# Patient Record
Sex: Female | Born: 1965 | Race: White | Marital: Single | Smoking: Current every day smoker
Health system: Northeastern US, Academic
[De-identification: ages and names within clinical notes are randomized; demographics above are authoritative.]

## PROBLEM LIST (undated history)

## (undated) DIAGNOSIS — G43909 Migraine, unspecified, not intractable, without status migrainosus: Secondary | ICD-10-CM

## (undated) DIAGNOSIS — M545 Low back pain, unspecified: Secondary | ICD-10-CM

## (undated) DIAGNOSIS — F419 Anxiety disorder, unspecified: Secondary | ICD-10-CM

## (undated) DIAGNOSIS — G4710 Hypersomnia, unspecified: Secondary | ICD-10-CM

## (undated) DIAGNOSIS — F329 Major depressive disorder, single episode, unspecified: Secondary | ICD-10-CM

## (undated) DIAGNOSIS — Z9989 Dependence on other enabling machines and devices: Secondary | ICD-10-CM

## (undated) DIAGNOSIS — Q796 Ehlers-Danlos syndrome, unspecified: Secondary | ICD-10-CM

## (undated) DIAGNOSIS — G4733 Obstructive sleep apnea (adult) (pediatric): Secondary | ICD-10-CM

## (undated) DIAGNOSIS — F32A Depression, unspecified: Secondary | ICD-10-CM

## (undated) HISTORY — DX: Anxiety disorder, unspecified: F41.9

## (undated) HISTORY — DX: Ehlers-Danlos syndrome, unspecified: Q79.60

## (undated) HISTORY — DX: Major depressive disorder, single episode, unspecified: F32.9

## (undated) HISTORY — DX: Dependence on other enabling machines and devices: Z99.89

## (undated) HISTORY — DX: Low back pain: M54.5

## (undated) HISTORY — DX: Depression, unspecified: F32.A

## (undated) HISTORY — DX: Hypersomnia, unspecified: G47.10

## (undated) HISTORY — DX: Obstructive sleep apnea (adult) (pediatric): G47.33

## (undated) HISTORY — DX: Low back pain, unspecified: M54.50

## (undated) HISTORY — DX: Migraine, unspecified, not intractable, without status migrainosus: G43.909

## (undated) NOTE — Progress Notes (Signed)
Formatting of this note is different from the original.  Annual Gyn Exam  Bonnie Faulkner is a 78 y.o. postmenopausal female who presents for an annual exam.  The patient has no complaints today.  Notes that she stopped her patches and didn't have any hot flashes or night sweats.      Mammogram: normal--routine follow-up in 12 months  Colonoscopy: doing yearly stool samples  The patient was taught and performs regular BSE.    The patient does wear seatbelts.  The patient does not participate in regular exercise.  The patient does consume fruit and fiber in her diet.    Sexual dysfunction:no not active    The patient reports that there is No domestic violence in her life.      Personal health questionnaire reviewed.    Problem List:  Patient Active Problem List   Diagnosis   ? Vulvar intraepithelial neoplasia III (VIN III)   ? Moderate obstructive sleep apnea     Patient History*    Past Medical History:   Diagnosis Date   ? Breast disorder    ? 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 Mental disorder    ? 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  Stable at last recorded visit in [place] in 2013.     Past Surgical History:   Procedure Laterality Date   ? BACK SURGERY     ? HYSTERECTOMY      partial   ? KNEE SURGERY     ? LAMINECTOMY     ? THUMB FUSION     ? TOE SURGERY       Social History     Tobacco Use   ? Smoking status: Every Day     Packs/day: 0.50     Types: Cigarettes   ? Smokeless tobacco: Never   Substance Use Topics   ? Alcohol use: Yes     Alcohol/week: 1.0 standard drink     Types: 1 Shots of liquor per week     Comment: socially daily     Family History   Problem Relation Age of Onset   ? Liver cancer Maternal Grandfather    ? Ovarian cancer Mother    ? Melanoma Brother    ? Stroke Maternal Grandmother    ? Heart disease Father        Menstrual History:  OB History     Gravida   0    Para   0    Term   0    Preterm   0     AB   0    Living   0      SAB   0    IAB   0    Ectopic   0    Multiple   0    Live Births   0           Menarche age:   No LMP recorded. Patient has had a hysterectomy.      No LMP recorded. Patient has had a hysterectomy.    Review of Systems:  Pertinent items are noted in HPI. Annual update form reviewed in detail with the patient.    Vitals:    09/22/21 1358   BP: (!) 142/70   Pulse:    Temp:     Body mass index is 38.27 kg/m.  Visit Vitals  BP (!) 142/70 (BP Location: Left arm, Patient Position: Sitting)   Pulse 88   Temp 36.7 C (98 F)   Ht 5\' 5"  (1.651 m)   Wt 230 lb (104.3 kg)   BMI 38.27 kg/m   OB Status Hysterectomy   Smoking Status Every Day   BSA 2.19 m     General: Well developed, well nourished female in NAD.  Psych:   Alert, oriented to time, place and person. Normal mood and affect.  Neuro:   Grossly intact.  Skin:      Warm, normal turgor, no rashes, no suspicious lesions or ulcers.  HEENT:  NCAT, Normal conjunctiva, Pupils equal and round, Hearing WNL, Lips without leslons.  Neck/Thyroid: Non tender. No thyromegaly noted.  Lungs: CTA bilaterally, normal respiratory effort.  Cardiovascular: RRR, no murmurs.  Back: No CVAT.  Abdomen: Soft, NT non distended, no masses, hernia or hepato/splenomegaly  Breasts: No suspicious masses, no nipple discharge, no dimpling  Lymphatic: No cervical, supraclaviculor, axillary or groin adenopathy  Extremities: Non tender, no edema, Free range of motion  Pelvic Exam:  External Genitalia: Normal structures. No suspicious lesions.  Bladder: Normal. No masses, non tender, no significant prolapse.  BUS: Normal Bartholin's, urethral and Skene's glands. No masses, non tender.  Vagina: No vaginal discharge, lesions or bleeding.  Cervix:  Surgically absent.  Uterus: Surgically absent.  Adnexae:  Right No masses, No tenderness.  Left:  No masses, No tenderness.  Perineum: Normal. No suspicious lesions.  Rectal: No hemorrhoids    Depression Screening:  Little interest or  pleasure in doing things: no   Feeling down depressed or hopeless: no     Assessment:     Healthy female exam.     Plan:    All questions answered.  Discussed healthy lifestyle modifications. Printed information given.       Diagnosis Plan   1. Encounter for annual routine gynecological examination       2. Vulvar intraepithelial neoplasia III (VIN III)       No new lesions on the vulva today.  Follow up one year for reexamination.     well woman    breast self exam  focused on the need for regular aerobic exercise    Return in about 1 year (around 09/22/2022) for follow up VIN I/AE.    Jeraldine Loots, MD  09/22/2021  2:19 PM  Electronically signed by Jeraldine Loots, MD at 09/22/2021  2:20 PM EST

## (undated) NOTE — Progress Notes (Signed)
Formatting of this note might be different from the original.  Pt presents today for an annual exam.  Chaperone desired: no      Pt has no complaints    Discussion with patient Jeraldine Loots, MD  will review your present concerns to gather more information.  Your visit today is scheduled for an annual examination which is a routine screening and maintenance visit.   You may be asked to return to discuss your concerns further in depth.  If Jeraldine Loots, MD is able to address concerns today it may result in an additional charge/and or copay.     No LMP recorded. Patient has had a hysterectomy.  Last pap: Date: unsure of most recent.    Offered HIV screening and pt Accepted/Declined: declined    Aug 17, 1965  Hep C screening:Pt born between 20-1965 Yes declined    15-25  STD screening offered and Accepted/Declined: declined    40+  Last Mammo: Date: patient states she had one 04/2021    50+  Last colonoscopy: Date: patient states she had never had one    Depression score of 0     Patients blood pressure was elevated at 153/78. Manual recheck done. New reading of 142/70  Electronically signed by Gracy Bruins, LPN at 16/05/9603  1:58 PM EST

---
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
## 1987-08-10 HISTORY — PX: KNEE SURGERY: SHX244

## 2003-08-10 HISTORY — PX: VAGINAL HYSTERECTOMY: SUR661

## 2005-12-09 ENCOUNTER — Ambulatory Visit (HOSPITAL_COMMUNITY): Admission: AD | Admit: 2005-12-09 | Discharge: 2005-12-09 | Payer: Self-pay | Admitting: Family Medicine

## 2008-04-26 ENCOUNTER — Other Ambulatory Visit: Admission: RE | Admit: 2008-04-26 | Discharge: 2008-04-26 | Payer: Self-pay | Admitting: Family Medicine

## 2008-06-03 ENCOUNTER — Other Ambulatory Visit: Admission: RE | Admit: 2008-06-03 | Discharge: 2008-06-03 | Payer: Self-pay | Admitting: Obstetrics and Gynecology

## 2008-06-20 ENCOUNTER — Ambulatory Visit (HOSPITAL_COMMUNITY): Admission: RE | Admit: 2008-06-20 | Discharge: 2008-06-20 | Payer: Self-pay | Admitting: Family Medicine

## 2008-10-01 ENCOUNTER — Other Ambulatory Visit: Admission: RE | Admit: 2008-10-01 | Discharge: 2008-10-01 | Payer: Self-pay | Admitting: Obstetrics and Gynecology

## 2009-10-01 ENCOUNTER — Other Ambulatory Visit: Admission: RE | Admit: 2009-10-01 | Discharge: 2009-10-01 | Payer: Self-pay | Admitting: Obstetrics and Gynecology

## 2010-03-31 ENCOUNTER — Other Ambulatory Visit: Admission: RE | Admit: 2010-03-31 | Discharge: 2010-03-31 | Payer: Self-pay | Admitting: Obstetrics and Gynecology

## 2010-05-21 ENCOUNTER — Ambulatory Visit (HOSPITAL_COMMUNITY): Admission: RE | Admit: 2010-05-21 | Discharge: 2010-05-21 | Payer: Self-pay | Admitting: Family Medicine

## 2010-10-15 ENCOUNTER — Other Ambulatory Visit (HOSPITAL_COMMUNITY)
Admission: RE | Admit: 2010-10-15 | Discharge: 2010-10-15 | Disposition: A | Discharge status: Home / Self Care | Payer: Commercial Indemnity | Source: Ambulatory Visit | Attending: Obstetrics and Gynecology | Admitting: Obstetrics and Gynecology

## 2010-10-15 DIAGNOSIS — Z01419 Encounter for gynecological examination (general) (routine) without abnormal findings: Secondary | ICD-10-CM | POA: Insufficient documentation

## 2011-06-04 ENCOUNTER — Other Ambulatory Visit (HOSPITAL_COMMUNITY): Payer: Self-pay | Admitting: Family Medicine

## 2011-06-04 DIAGNOSIS — Z1231 Encounter for screening mammogram for malignant neoplasm of breast: Secondary | ICD-10-CM

## 2011-06-30 ENCOUNTER — Ambulatory Visit (HOSPITAL_COMMUNITY)
Admission: RE | Admit: 2011-06-30 | Discharge: 2011-06-30 | Disposition: A | Discharge status: Home / Self Care | Payer: Commercial Indemnity | Source: Ambulatory Visit | Attending: Family Medicine | Admitting: Family Medicine

## 2011-06-30 DIAGNOSIS — Z1231 Encounter for screening mammogram for malignant neoplasm of breast: Secondary | ICD-10-CM | POA: Insufficient documentation

## 2011-10-19 ENCOUNTER — Other Ambulatory Visit (HOSPITAL_COMMUNITY)
Admission: RE | Admit: 2011-10-19 | Discharge: 2011-10-19 | Disposition: A | Discharge status: Home / Self Care | Payer: Commercial Indemnity | Source: Ambulatory Visit | Attending: Obstetrics and Gynecology | Admitting: Obstetrics and Gynecology

## 2011-10-19 ENCOUNTER — Other Ambulatory Visit: Payer: Self-pay | Admitting: Nurse Practitioner

## 2011-10-19 DIAGNOSIS — Z01419 Encounter for gynecological examination (general) (routine) without abnormal findings: Secondary | ICD-10-CM | POA: Insufficient documentation

## 2012-06-06 ENCOUNTER — Other Ambulatory Visit (HOSPITAL_COMMUNITY): Payer: Self-pay | Admitting: Family Medicine

## 2012-06-06 DIAGNOSIS — Z1231 Encounter for screening mammogram for malignant neoplasm of breast: Secondary | ICD-10-CM

## 2012-06-30 ENCOUNTER — Inpatient Hospital Stay: Admit: 2012-06-30 | Discharge: 2012-06-30 | Disposition: A | Discharge status: Home / Self Care | Payer: Self-pay

## 2012-06-30 ENCOUNTER — Ambulatory Visit (HOSPITAL_COMMUNITY)
Admission: RE | Admit: 2012-06-30 | Discharge: 2012-06-30 | Disposition: A | Discharge status: Home / Self Care | Payer: BC Managed Care – PPO | Source: Ambulatory Visit | Attending: Family Medicine | Admitting: Family Medicine

## 2012-06-30 DIAGNOSIS — Z1231 Encounter for screening mammogram for malignant neoplasm of breast: Secondary | ICD-10-CM

## 2012-11-06 ENCOUNTER — Ambulatory Visit (INDEPENDENT_AMBULATORY_CARE_PROVIDER_SITE_OTHER): Payer: BC Managed Care – PPO | Admitting: Neurology

## 2012-11-06 ENCOUNTER — Other Ambulatory Visit: Payer: Self-pay | Admitting: *Deleted

## 2012-11-06 DIAGNOSIS — G4710 Hypersomnia, unspecified: Secondary | ICD-10-CM

## 2012-11-06 DIAGNOSIS — G4733 Obstructive sleep apnea (adult) (pediatric): Secondary | ICD-10-CM

## 2012-11-07 ENCOUNTER — Encounter: Payer: Self-pay | Admitting: *Deleted

## 2012-11-17 ENCOUNTER — Other Ambulatory Visit: Payer: Self-pay | Admitting: *Deleted

## 2012-11-17 DIAGNOSIS — G4733 Obstructive sleep apnea (adult) (pediatric): Secondary | ICD-10-CM

## 2012-11-17 DIAGNOSIS — G4710 Hypersomnia, unspecified: Secondary | ICD-10-CM

## 2012-11-28 ENCOUNTER — Encounter: Payer: Self-pay | Admitting: Neurology

## 2012-12-04 ENCOUNTER — Telehealth: Payer: Self-pay | Admitting: *Deleted

## 2012-12-04 NOTE — Telephone Encounter (Signed)
LM for patient at home, tried cell but no voicemail option setup.  Asked pt to call me and we could discuss sleep study results.  Also let her know a copy would be mailed to her. -sh

## 2013-01-19 ENCOUNTER — Ambulatory Visit (INDEPENDENT_AMBULATORY_CARE_PROVIDER_SITE_OTHER): Payer: BC Managed Care – PPO | Admitting: Neurology

## 2013-01-19 ENCOUNTER — Encounter: Payer: Self-pay | Admitting: Neurology

## 2013-01-19 VITALS — BP 132/84 | HR 62 | Temp 98.6°F | Ht 66.0 in | Wt 216.0 lb

## 2013-01-19 DIAGNOSIS — G4710 Hypersomnia, unspecified: Secondary | ICD-10-CM | POA: Insufficient documentation

## 2013-01-19 DIAGNOSIS — G4733 Obstructive sleep apnea (adult) (pediatric): Secondary | ICD-10-CM | POA: Insufficient documentation

## 2013-01-19 DIAGNOSIS — F329 Major depressive disorder, single episode, unspecified: Secondary | ICD-10-CM

## 2013-01-19 NOTE — Patient Instructions (Addendum)
CPAP and BIPAP CPAP and BIPAP are methods of helping you breathe. CPAP stands for "continuous positive airway pressure." BIPAP stands for "bi-level positive airway pressure." Both CPAP and BIPAP are provided by a small machine with a flexible plastic tube that attaches to a plastic mask that goes over your nose or mouth. Air is blown into your air passages through your nose or mouth. This helps to keep your airways open and helps to keep you breathing well. The amount of pressure that is used to blow the air into your air passages can be set on the machine. The pressure setting is based on your needs. With CPAP, the amount of pressure stays the same while you breathe in and out. With BIPAP, the amount of pressure changes when you inhale and exhale. Your caregiver will recommend whether CPAP or BIPAP would be more helpful for you.  CPAP and BIPAP can be helpful for both adults and children with:  Sleep apnea.  Chronic Obstructive Pulmonary Disease (COPD), a condition like emphysema.  Diseases which weaken the muscles of the chest such as muscular dystrophy or neurological diseases.  Other problems that cause breathing to be weak or difficult. USE OF CPAP OR BIPAP The respiratory therapist or technician will help you get used to wearing the mask. Some people feel claustrophobic (a trapped or closed in feeling) at first, because the mask needs to be fairly snug on your face.   It may help you to get used to the mask gradually, by first holding the mask loosely over your nose or mouth using a low pressure setting on the machine. Gradually the mask can be applied more snugly with increased pressure. You can also gradually increase the amount of time the mask is used.  People with sleep apnea will use the mask and machine at night when they are sleeping. Others, like those with ALS or other breathing difficulties, may need the CPAP or BIPAP all the time.  If the first mask you try does not fit well, or  is uncomfortable, there are other types and sizes that can be tried.  If you tend to breathe through your mouth, a chin strap may be applied to help keep your mouth closed (if you are using a nasal mask).  The CPAP and BIPAP machines have alarms that may sound if the mask comes off or develops a leak.  You should not eat or drink while the CPAP or BIPAP is on. Food or fluids could get pushed into your lungs by the pressure of the CPAP or BIPAP. Sometimes CPAP or BIPAP machines are ordered for home use. If you are going to use the CPAP or BIPAP machine at home, follow these instructions  CPAP or BIPAP machines can be rented or purchased through home health care companies. There are many different brands of machines available. If you rent a machine before purchasing you may find which particular machine works well for you.  Ask questions if there is something you do not understand when picking out your machine.  Place your CPAP or BIPAP machine on a secure table or stand near an electrical outlet.  Know where the On/Off switch is.  Follow your doctor's instructions for how to set the pressure on your machine and when you should use it.  Do not smoke! Tobacco smoke residue can damage the machine. SEEK IMMEDIATE MEDICAL CARE IF:   You have redness or open areas around your nose or mouth.  You have trouble operating  the CPAP or BIPAP machine.  You cannot tolerate wearing the CPAP or BIPAP mask.  You have any questions or concerns. Document Released: 04/23/2004 Document Revised: 10/18/2011 Document Reviewed: 07/23/2008 [facility] Patient Information 2014 [place], [place]. Exercise to Lose Weight Exercise and a healthy diet may help you lose weight. Your doctor may suggest specific exercises. EXERCISE IDEAS AND TIPS  Choose low-cost things you enjoy doing, such as walking, bicycling, or exercising to workout videos.  Take stairs instead of the elevator.  Walk during your lunch  break.  Park your car further away from work or school.  Go to a gym or an exercise class.  Start with 5 to 10 minutes of exercise each day. Build up to 30 minutes of exercise 4 to 6 days a week.  Wear shoes with good support and comfortable clothes.  Stretch before and after working out.  Work out until you breathe harder and your heart beats faster.  Drink extra water when you exercise.  Do not do so much that you hurt yourself, feel dizzy, or get very short of breath. Exercises that burn about 150 calories:  Running 1  miles in 15 minutes.  Playing volleyball for 45 to 60 minutes.  Washing and waxing a car for 45 to 60 minutes.  Playing touch football for 45 minutes.  Walking 1  miles in 35 minutes.  Pushing a stroller 1  miles in 30 minutes.  Playing basketball for 30 minutes.  Raking leaves for 30 minutes.  Bicycling 5 miles in 30 minutes.  Walking 2 miles in 30 minutes.  Dancing for 30 minutes.  Shoveling snow for 15 minutes.  Swimming laps for 20 minutes.  Walking up stairs for 15 minutes.  Bicycling 4 miles in 15 minutes.  Gardening for 30 to 45 minutes.  Jumping rope for 15 minutes.  Washing windows or floors for 45 to 60 minutes. Document Released: 08/28/2010 Document Revised: 10/18/2011 Document Reviewed: 08/28/2010 [facility] Patient Information 2014 [place], [place]. Depression, Adult Depression refers to feeling sad, low, down in the dumps, blue, gloomy, or empty. In general, there are two kinds of depression: 1. Depression that we all experience from time to time because of upsetting life experiences, including the loss of a job or the ending of a relationship (normal sadness or normal grief). This kind of depression is considered normal, is short lived, and resolves within a few days to 2 weeks. (Depression experienced after the loss of a loved one is called bereavement. Bereavement often lasts longer than 2 weeks but normally gets better with  time.) 2. Clinical depression, which lasts longer than normal sadness or normal grief or interferes with your ability to function at home, at work, and in school. It also interferes with your personal relationships. It affects almost every aspect of your life. Clinical depression is an illness. Symptoms of depression also can be caused by conditions other than normal sadness and grief or clinical depression. Examples of these conditions are listed as follows:  Physical illness Some physical illnesses, including underactive thyroid gland (hypothyroidism), severe anemia, specific types of cancer, diabetes, uncontrolled seizures, heart and lung problems, strokes, and chronic pain are commonly associated with symptoms of depression.  Side effects of some prescription medicine In some people, certain types of prescription medicine can cause symptoms of depression.  Substance abuse Abuse of alcohol and illicit drugs can cause symptoms of depression. SYMPTOMS Symptoms of normal sadness and normal grief include the following:  Feeling sad or crying for short  periods of time.  Not caring about anything (apathy).  Difficulty sleeping or sleeping too much.  No longer able to enjoy the things you used to enjoy.  Desire to be by oneself all the time (social isolation).  Lack of energy or motivation.  Difficulty concentrating or remembering.  Change in appetite or weight.  Restlessness or agitation. Symptoms of clinical depression include the same symptoms of normal sadness or normal grief and also the following symptoms:  Feeling sad or crying all the time.  Feelings of guilt or worthlessness.  Feelings of hopelessness or helplessness.  Thoughts of suicide or the desire to harm yourself (suicidal ideation).  Loss of touch with reality (psychotic symptoms). Seeing or hearing things that are not real (hallucinations) or having false beliefs about your life or the people around you (delusions  and paranoia). DIAGNOSIS  The diagnosis of clinical depression usually is based on the severity and duration of the symptoms. Your caregiver also will ask you questions about your medical history and substance use to find out if physical illness, use of prescription medicine, or substance abuse is causing your depression. Your caregiver also may order blood tests. TREATMENT  Typically, normal sadness and normal grief do not require treatment. However, sometimes antidepressant medicine is prescribed for bereavement to ease the depressive symptoms until they resolve. The treatment for clinical depression depends on the severity of your symptoms but typically includes antidepressant medicine, counseling with a mental health professional, or a combination of both. Your caregiver will help to determine what treatment is best for you. Depression caused by physical illness usually goes away with appropriate medical treatment of the illness. If prescription medicine is causing depression, talk with your caregiver about stopping the medicine, decreasing the dose, or substituting another medicine. Depression caused by abuse of alcohol or illicit drugs abuse goes away with abstinence from these substances. Some adults need professional help in order to stop drinking or using drugs. SEEK IMMEDIATE CARE IF:  You have thoughts about hurting yourself or others.  You lose touch with reality (have psychotic symptoms).  You are taking medicine for depression and have a serious side effect. FOR MORE INFORMATION National [employer] on Mental Illness: www.nami.[employer]: http://www.maynard.net/ Document Released: 07/23/2000 Document Revised: 01/25/2012 Document Reviewed: 10/25/2011 [facility] Patient Information 2014 [place], [place]. Hypersomnia Hypersomnia usually brings recurrent episodes of excessive daytime sleepiness or prolonged nighttime sleep. It is different than feeling tired due to  lack of or interrupted sleep at night. People with hypersomnia are compelled to nap repeatedly during the day. This is often at inappropriate times such as:  At work.  During a meal.  In conversation. These daytime naps usually provide no relief. This disorder typically affects adolescents and young adults. CAUSES  This condition may be caused by:  Another sleep disorder (such as narcolepsy or sleep apnea).  Dysfunction of the autonomic nervous system.  Drug or alcohol abuse.  A physical problem, such as:  A tumor.  Head trauma. This is damage caused by an accident.  Injury to the central nervous system.  Certain medications, or medicine withdrawal.  Medical conditions may contribute to the disorder, including:  Multiple sclerosis.  Depression.  Encephalitis.  Epilepsy.  Obesity.  Some people appear to have a genetic predisposition to this disorder. In others, there is no known cause. SYMPTOMS   Patients often have difficulty waking from a long sleep. They may feel dazed or confused.  Other symptoms may include:  Anxiety.  Increased irritation (inflammation).  Decreased energy.  Restlessness.  Slow thinking.  Slow speech.  Loss of appetite.  Hallucinations.  Memory difficulty.  Tremors, Tics.  Some patients lose the ability to function in family, social, occupational, or other settings. TREATMENT  Treatment is symptomatic in nature. Stimulants and other drugs may be used to treat this disorder. Changes in behavior may help. For example, avoid night work and social activities that delay bed time. Changes in diet may offer some relief. Patients should avoid alcohol and caffeine. PROGNOSIS  The likely outcome (prognosis) for persons with hypersomnia depends on the cause of the disorder. The disorder itself is not life threatening. But it can have serious consequences. For example, automobile accidents can be caused by falling asleep while driving.  The attacks usually continue indefinitely. Document Released: 07/16/2002 Document Revised: 10/18/2011 Document Reviewed: 06/19/2008 [facility] Patient Information 2014 [place], [place].

## 2013-01-19 NOTE — Progress Notes (Addendum)
[facility]  Provider:  Dr Tanya Stanley Referring Provider: No ref. provider found Primary Care Physician:  No primary provider on file.  Chief Complaint  Patient presents with  . Follow-up    excessive sleepiness    HPI:  Tanya Stanley is a 47 y.o. female here as a referral from Dr. Carolin Stanley. [name] was referred for evaluation of possible narcolepsy and seen on 10-20-12. At that time she had a history of excessive daytime sleepiness for many years but she also had an acute depression. She stated that she awoke barely refreshed in the morning even after getting over on estimates 7-8 hours of sleep. She naps whenever she could.  She has  no shift work history and she had begun to schedule herlive around  Nap times. Epworth was initially 15 times in the night of the sleep study , her Epworth was 20 points.  Was  on multiple antidepressants at the time and it was deemed not possible to have a valid daytime sleepiness test such as MSLT.( She was on REM sleep suppressant medications)  We  discussed the weaning schedule in March to prepare her for possible test.  Nuvigil l was prescribed,  but her new insurance refused to pay for it "without a sleep diagnosis ". The patient underwent 1 polysomnography in 2012, which failed to detect any apnea at the time. In the interval she gained weight, about 40 pounds. In suffer from major depression which was treated this medication that also led to weight gain.  On 11/06/2012 the patient underwent a split night polysomnography. Her BMI was 37 at the time Epworth score 20 points , Beck's  Score 13 points. Her  neck circumference 16 inches.   AHI was 27.9 RDI 36.9 the patient had a significant contribution to her poor sleep quality from respiratory events that were deemed to be related to upper airway syndrome. The arousal index was very high at 31.5 the patient was titrated first of CPAP which she could not tolerate and dental BiPAP over 10  cm of a 6 cm water which was effective. The patient did not have to stay r the next day after the identified an organic sleep disorder as a possible cause of her hypersomnia.    In May she finally received the BiPAP machine and has now used it for 30 days. Reports that many nights she pulled the mask off she does not like to use the BiPAP and she has not felt that it made a difference in her daytime sleepiness at all. Patient is still continued to take naps.   Her download shows an  93 % compliance ,  AHi is 1.8. 01-19-13 .  The patient is now working from 5:30 AM today to 2 PM. Her sleep time is irregular, sometimes at 6 PM sometimes 11 PM. She falls asleep within 15-20 minutes. He has no nocturia. She has no history of restless legs , was never told that she snores or has any apneas. The persistent hypersomnia with an Epworth score of 13 pints today is slightly improved in comparison to the pre-study level. Beck's 13 points, FSS is 63.  The patient reports that she became very tearful after weaning off  Prozac, resumed now  Lexapro  and Wellbutrin. She remains tearful and depressed.    [place] is a patient that we should work on a regular sleep schedule. She has not resumed Wellbutrin but Lexapro -we may still want to do the MSL T now based  on her leg of response to the BiPAP titration. I will schedule her for an MSL T. as well as possible, ask her to just stop the Lexapro for the next couple of days until hopefully the test can be done. She asked  not be scheduled with a female.         Review of Systems: Out of a complete 14 system review, the patient complains of only the following symptoms, and all other reviewed systems are negative. Epworth 13 ,  Depression, frustration, impulsive, anti social.  History   Social History  . Marital Status: Single    Spouse Name: N/A    Number of Children: 0  . Years of Education: BA   Occupational History  . Not on file.   Social History  Main Topics  . Smoking status: Current Every Day Smoker -- 0.75 packs/day for 20 years  . Smokeless tobacco: Never Used  . Alcohol Use: Yes     Comment: 5 mixed drinks weekly  . Drug Use: No  . Sexually Active: Not on file   Other Topics Concern  . Not on file   Social History Narrative   Pt lives at home alone.   Caffeine Use: 1-2 cups daily    Family History  Problem Relation Age of Onset  . Ovarian cancer Mother     Past Medical History  Diagnosis Date  . EDS (Ehlers-Danlos syndrome)   . Migraine   . Anxiety and depression   . LBP (low back pain)   . Obstructive apnea     PSG 11-06-12, AHI 27.9 , RDI 36.9 , bipap  10/6 cm   . Hypersomnia, persistent     continued Epworth 20 and higher on BIPAP>     Past Surgical History  Procedure Laterality Date  . Toe surgery  1985    ingrown toenail  . Knee surgery  1989  . Vaginal hysterectomy  2005    Current Outpatient Prescriptions  Medication Sig Dispense Refill  . calcium-vitamin D (OSCAL) 250-125 MG-UNIT per tablet Take 2 tablets by mouth daily.      Marland Kitchen co-enzyme Q-10 50 MG capsule Take 50 mg by mouth daily.      Marland Kitchen escitalopram (LEXAPRO) 10 MG tablet Take 10 mg by mouth every other day.      . ferrous sulfate 325 (65 FE) MG tablet Take 325 mg by mouth daily with breakfast.      . Multiple Vitamins-Minerals (MULTIVITAMIN PO) Take 1 tablet by mouth daily.      . Omega-3 Fatty Acids (FISH OIL) 1000 MG CAPS Take by mouth.      . valACYclovir (VALTREX) 1000 MG tablet Take 1,000 mg by mouth as needed.      . Vitamin B Complex-C CAPS Take 1 capsule by mouth daily.      Marland Kitchen ALPRAZolam (XANAX) 0.5 MG tablet Take 1 tablet by mouth as needed.      . fluticasone (FLONASE) 50 MCG/ACT nasal spray Place 2 sprays into the nose daily.       No current facility-administered medications for this visit.    Allergies as of 01/19/2013  . (No Known Allergies)    Vitals: BP 132/84  Pulse 62  Temp(Src) 98.6 F (37 C) (Oral)  Ht 5\' 6"   (1.676 m)  Wt 216 lb (97.977 kg)  BMI 34.88 kg/m2 Last Weight:  Wt Readings from Last 1 Encounters:  01/19/13 216 lb (97.977 kg)   Last Height:   Ht Readings  from Last 1 Encounters:  01/19/13 5\' 6"  (1.676 m)     Physical exam:  General: The patient is awake, alert and appears not in acute distress. The patient is well groomed. Head: Normocephalic, atraumatic. Neck is supple. Mallampati3, neck circumference: 16.5 inches  Cardiovascular:  Regular rate and rhythm, without  murmurs or carotid bruit, and without distended neck veins. Respiratory: Lungs are clear to auscultation. Skin:  Without evidence of edema, or rash Trunk: BMI is elevated .  Neurologic exam : The patient is awake and alert, oriented to place and time.  Memory subjective  described as intact. There is a limitedl attention span & concentration ability. Speech is fluent without  dysarthria, dysphonia or aphasia. Mood and affect are appropriate.  Cranial nerves: Pupils are equal and briskly reactive to light. Funduscopic exam without  evidence of pallor or edema. Extraocular movements  in vertical and horizontal planes intact and without nystagmus. Visual fields by finger perimetry are intact. Hearing to finger rub intact.  Facial sensation intact to fine touch. Facial motor strength is symmetric and tongue and uvula move midline.  Motor exam: Normal tone and normal muscle bulk and symmetric normal strength in all extremities.     Assessment:  After physical and neurologic examination, review of laboratory studies, imaging, neurophysiology testing and pre-existing records, assessment will be reviewed on the problem list.  Depression Hypersomia Obesity Apnea on BiPAP   Plan:  Treatment plan and additional workup will be reviewed under Problem List.

## 2013-01-29 ENCOUNTER — Encounter: Payer: Self-pay | Admitting: Neurology

## 2013-01-29 ENCOUNTER — Ambulatory Visit (INDEPENDENT_AMBULATORY_CARE_PROVIDER_SITE_OTHER): Payer: BC Managed Care – PPO | Admitting: Neurology

## 2013-01-29 DIAGNOSIS — G4710 Hypersomnia, unspecified: Secondary | ICD-10-CM

## 2013-01-29 DIAGNOSIS — G4713 Recurrent hypersomnia: Secondary | ICD-10-CM

## 2013-01-29 DIAGNOSIS — G4733 Obstructive sleep apnea (adult) (pediatric): Secondary | ICD-10-CM

## 2013-01-30 DIAGNOSIS — G4733 Obstructive sleep apnea (adult) (pediatric): Secondary | ICD-10-CM

## 2013-01-30 DIAGNOSIS — G4713 Recurrent hypersomnia: Secondary | ICD-10-CM

## 2013-02-07 ENCOUNTER — Other Ambulatory Visit: Payer: Self-pay | Admitting: Neurology

## 2013-02-07 DIAGNOSIS — G4733 Obstructive sleep apnea (adult) (pediatric): Secondary | ICD-10-CM

## 2013-02-07 DIAGNOSIS — G4710 Hypersomnia, unspecified: Secondary | ICD-10-CM

## 2013-02-12 ENCOUNTER — Telehealth: Payer: Self-pay | Admitting: *Deleted

## 2013-02-12 ENCOUNTER — Encounter: Payer: Self-pay | Admitting: *Deleted

## 2013-02-12 NOTE — Telephone Encounter (Signed)
Left message for patient regarding sleep study results, asked patient to call me back to discuss results and have questions answered.  Explained that a copy of the sleep study was sent to referring physician and copy of study is coming to them in the mail.  Pt made aware that current BiPAP settings are working very well for her but that she did fall asleep fairly quickly during her MSLT and a follow up appt with Dr. Vickey Huger to discuss those results is needed.

## 2013-02-12 NOTE — Telephone Encounter (Signed)
Spk to [facility] regarding test results and follow up appt with Dr. Vickey Stanley.  Pt states she has used Provigil which Dr. Gerri Stanley prescribed and it was very helpful to her, it was not covered by her insurance company however without the MSLT.  Patient feels a little urgent in trying to get treatment for her EDS because she is going out of town soon and has to drive 11 hours to [place] - Dr. Clyde Stanley advised her not to drive without treatment.  We rescheduled patient's follow up appt with Dr. Vickey Stanley from the 17th to Thursday the 10th in order to get treatment covered sooner.  Pt states if there is anything we can do prior to this appt to get this medication authorized, she would be very appreciative because she is also concerned about driving but has to drive.  We discussed Dr. Oliva Stanley impressions that untreated Depression may be a very likely contributor to Tanya Stanley's EDS and her non-restorative sleep, she agrees.  She tried to get appt with Tanya Stanley however her first available appt isn't until August, however she was able to schedule with Tanya Stanley for a week from this Thursday in order to get some help.

## 2013-02-13 ENCOUNTER — Telehealth: Payer: Self-pay | Admitting: Neurology

## 2013-02-13 NOTE — Telephone Encounter (Signed)
Hello Dr. Vickey Huger, I moved this patient's appt up to this Thursday at 11:30 (right before lunch and at the end of your read time). See phone note for more info. Pt is leaving town and has a long drive, she is very concerned about her sleepiness and would like to discuss treatment for EDS sooner if possible. If there is a problem with this appt, please let me know and I can try and reschedule, this was the only place I could see putting her this week, there was nothing else available. Thank you!  SH  02-13-13

## 2013-02-15 ENCOUNTER — Encounter: Payer: Self-pay | Admitting: Neurology

## 2013-02-15 ENCOUNTER — Ambulatory Visit (INDEPENDENT_AMBULATORY_CARE_PROVIDER_SITE_OTHER): Payer: BC Managed Care – PPO | Admitting: Neurology

## 2013-02-15 VITALS — BP 150/93 | HR 76 | Resp 14 | Ht 66.0 in | Wt 222.0 lb

## 2013-02-15 DIAGNOSIS — G4730 Sleep apnea, unspecified: Secondary | ICD-10-CM

## 2013-02-15 DIAGNOSIS — E669 Obesity, unspecified: Secondary | ICD-10-CM | POA: Insufficient documentation

## 2013-02-15 DIAGNOSIS — G4710 Hypersomnia, unspecified: Secondary | ICD-10-CM

## 2013-02-15 DIAGNOSIS — G4733 Obstructive sleep apnea (adult) (pediatric): Secondary | ICD-10-CM

## 2013-02-15 MED ORDER — ARMODAFINIL 250 MG PO TABS: 250.0000 mg | ORAL_TABLET | Freq: Every day | ORAL | Status: DC

## 2013-02-15 NOTE — Patient Instructions (Signed)
Hypersomnia Hypersomnia usually brings recurrent episodes of excessive daytime sleepiness or prolonged nighttime sleep. It is different than feeling tired due to lack of or interrupted sleep at night. People with hypersomnia are compelled to nap repeatedly during the day. This is often at inappropriate times such as:  At work.  During a meal.  In conversation. These daytime naps usually provide no relief. This disorder typically affects adolescents and young adults. CAUSES  This condition may be caused by:  Another sleep disorder (such as narcolepsy or sleep apnea).  Dysfunction of the autonomic nervous system.  Drug or alcohol abuse.  A physical problem, such as:  A tumor.  Head trauma. This is damage caused by an accident.  Injury to the central nervous system.  Certain medications, or medicine withdrawal.  Medical conditions may contribute to the disorder, including:  Multiple sclerosis.  Depression.  Encephalitis.  Epilepsy.  Obesity.  Some people appear to have a genetic predisposition to this disorder. In others, there is no known cause. SYMPTOMS   Patients often have difficulty waking from a long sleep. They may feel dazed or confused.  Other symptoms may include:  Anxiety.  Increased irritation (inflammation).  Decreased energy.  Restlessness.  Slow thinking.  Slow speech.  Loss of appetite.  Hallucinations.  Memory difficulty.  Tremors, Tics.  Some patients lose the ability to function in family, social, occupational, or other settings. TREATMENT  Treatment is symptomatic in nature. Stimulants and other drugs may be used to treat this disorder. Changes in behavior may help. For example, avoid night work and social activities that delay bed time. Changes in diet may offer some relief. Patients should avoid alcohol and caffeine. PROGNOSIS  The likely outcome (prognosis) for persons with hypersomnia depends on the cause of the disorder.  The disorder itself is not life threatening. But it can have serious consequences. For example, automobile accidents can be caused by falling asleep while driving. The attacks usually continue indefinitely. Document Released: 07/16/2002 Document Revised: 10/18/2011 Document Reviewed: 06/19/2008 Exit[employer] Patient Information 2014 [employer].  

## 2013-02-15 NOTE — Assessment & Plan Note (Signed)
Patient remains hypersomnic on BiPAP . Epworth 11 on modafinil.

## 2013-02-15 NOTE — Progress Notes (Signed)
[facility]  Provider:  Dr Vickey Huger Referring Provider: No ref. provider found Primary Care Physician:  Elie Confer, MD  No chief complaint on file.   HPI:  Tanya Stanley is a 47 y.o. female here as a referral from Dr. Gerri Spore.   BiPAP follow up and MSLT review.   2 we'll try an apnea index to proceed with Lamisil tea in her initial sleep study. The patient was weaned off antidepressant medications in preparation for a valid MS LT for followup. She returned for a PSG on positive airway pressure which revealed nor significant residual apnea her download confirms an AHI of 1.8 she was 100% CMS compliance of 30 days or at worse today it is 11 point is lower than it or past meetings her fatigue severity score remains at 63. Her positive airway pressure test night was developed as time by an MS LT the patient fell asleep with an 4.8 minutes on average in her 5 naps, and no SREM onsets.   I suspect that depression plays a role as well as idiopathic hypersomnia, but clinically she displayed symptoms of narcolepsy.   The patient download was reviewed in her presents it is dated 6-7 2014 the patient used an expiratory pressure of 6 cm water inspiration pressure of 10 cm water she has used the machine 30 over 30 days average daily usage 7 hours 6 minutes very little air leak and an AHI of 1.8. All this was confirmed in  the second sleep study that proceeded the  MS LT - Result was abnormal  ( MSL 4.8 minutes) and the report has been scanned into Epic.      Review of Systems: Out of a complete 14 system review, the patient complains of only the following symptoms, and all other reviewed systems are negative. Hypersomnia and severe depression.   The patient is awake and alert, oriented to place and time.  Memory subjective  described as intact. There is a normal attention span & concentration ability.  Speech is fluent without dysarthria, dysphonia or aphasia. Mood and  affect are appropriate.    Cranial nerves: Pupils are equal and briskly reactive to light. Funduscopic exam without  evidence of pallor or edema. Extraocular movements  in vertical and horizontal planes intact and without nystagmus. Visual fields by finger perimetry are intact. Hearing to finger rub intact.  Facial sensation intact to fine touch. Facial motor strength is symmetric and tongue and uvula move midline.  Motor exam: Normal tone and normal muscle bulk and symmetric normal strength in all extremities.  Sensory:  Fine touch, pinprick and vibration were tested in all extremities. Proprioception  normal.  Coordination: Rapid alternating movements in the fingers/hands is tested and normal. Finger-to-nose maneuver tested and normal without evidence of ataxia, dysmetria or tremor.  Gait and station: Patient walks without assistive device and is able and assisted stool climb up to the exam table. Strength within normal limits. Stance is stable and normal. Tandem gait is  unfragmented. Romberg normal.  Deep tendon reflexes: in the  upper and lower extremities are symmetric and intact. Babinski maneuverdowngoing.   Assessment:  After physical and neurologic examination, review of laboratory studies, imaging, neurophysiology testing and pre-existing records, assessment will be reviewed on the problem list.   PSG, PAP titration, downloads and MSLT reviewed and discussed.  Patient is compliant , 30 days download on BPAP   Plan:  Treatment plan and additional workup will be reviewed under Problem List. This patient is clearly  hypersomnia with a mean sleep latency of only 4.8 minutes also milligram onset was noted her Epworth sleepiness score of remains elevated after she was titrated it is reduced in comparison to pre-treatment of her apnea but it is not at a normal level. The patient is also been a frustrating work situation and is highly fatigued, this could be attributed to a depression.  Still the patient is treated apnea patient was documented compliance to PEEP and will receive more modafinil samples based on her diagnosis of persistent hypersomnia with treated sleep apnea.

## 2013-02-21 ENCOUNTER — Telehealth: Payer: Self-pay

## 2013-02-21 ENCOUNTER — Encounter: Payer: Self-pay | Admitting: Neurology

## 2013-02-21 NOTE — Telephone Encounter (Signed)
Patient called the sleep lab stating her medication (Nuvigil) is not covered by ins and needs authorization.  We have submitted info to ins for PA, pending their response.  I called the patient back.  She would like to get more samples while we are waiting for ins.  I dispensed Nuvigil 250mg  #7 Lot [id number] Exp 11/2015.  She will be in later this week to pick them up.

## 2013-02-22 ENCOUNTER — Institutional Professional Consult (permissible substitution): Payer: BC Managed Care – PPO | Admitting: Neurology

## 2013-06-18 ENCOUNTER — Other Ambulatory Visit (HOSPITAL_COMMUNITY): Payer: Self-pay | Admitting: Family Medicine

## 2013-06-18 DIAGNOSIS — Z1231 Encounter for screening mammogram for malignant neoplasm of breast: Secondary | ICD-10-CM

## 2013-07-09 ENCOUNTER — Ambulatory Visit (HOSPITAL_COMMUNITY): Payer: BC Managed Care – PPO

## 2013-07-10 ENCOUNTER — Telehealth: Payer: Self-pay | Admitting: Neurology

## 2013-07-11 ENCOUNTER — Other Ambulatory Visit: Payer: Self-pay

## 2013-07-11 DIAGNOSIS — G4710 Hypersomnia, unspecified: Secondary | ICD-10-CM

## 2013-07-11 DIAGNOSIS — G4730 Sleep apnea, unspecified: Secondary | ICD-10-CM

## 2013-07-11 MED ORDER — ARMODAFINIL 250 MG PO TABS: 250.0000 mg | ORAL_TABLET | Freq: Every day | ORAL | Status: DC

## 2013-07-11 NOTE — Telephone Encounter (Signed)
Rx faxed

## 2013-07-11 NOTE — Telephone Encounter (Signed)
Request forwarded to provider for approval since this is a controlled substance.

## 2013-07-18 ENCOUNTER — Ambulatory Visit (HOSPITAL_COMMUNITY): Payer: Commercial Indemnity

## 2013-08-13 ENCOUNTER — Inpatient Hospital Stay: Admit: 2013-08-13 | Discharge: 2013-08-13 | Disposition: A | Discharge status: Home / Self Care | Payer: Self-pay

## 2013-08-13 ENCOUNTER — Ambulatory Visit (HOSPITAL_COMMUNITY)
Admission: RE | Admit: 2013-08-13 | Discharge: 2013-08-13 | Disposition: A | Discharge status: Home / Self Care | Payer: 59 | Source: Ambulatory Visit | Attending: Family Medicine | Admitting: Family Medicine

## 2013-08-13 DIAGNOSIS — Z1231 Encounter for screening mammogram for malignant neoplasm of breast: Secondary | ICD-10-CM | POA: Insufficient documentation

## 2013-09-03 ENCOUNTER — Telehealth: Payer: Self-pay | Admitting: Neurology

## 2013-09-03 NOTE — Telephone Encounter (Signed)
All requested info has been sent to the new ins (card scanned today).  Pending response.  They will notify the patient of outcome.

## 2013-09-03 NOTE — Telephone Encounter (Signed)
Patient needs prior authorization for Nuvigil 250 mg.

## 2013-10-15 ENCOUNTER — Telehealth: Payer: Self-pay | Admitting: Neurology

## 2013-10-15 NOTE — Telephone Encounter (Signed)
Pt called in to check on the request for the prescription for Nuvigil be sent to [facility].  She placed her order on 10-04-13 request# [id number] and they are stating that the status is unknown.  She is almost out of her previous 90 day supply so she has asked if she could get a couple of samples to hold her until [facility] sends the new prescription.  Please contact patient with any additional questions. Thank you.

## 2013-10-15 NOTE — Telephone Encounter (Signed)
I called the patient back, got no answer.  Left message.  

## 2013-10-16 ENCOUNTER — Other Ambulatory Visit: Payer: Self-pay

## 2013-10-16 DIAGNOSIS — G4730 Sleep apnea, unspecified: Secondary | ICD-10-CM

## 2013-10-16 DIAGNOSIS — G4710 Hypersomnia, unspecified: Secondary | ICD-10-CM

## 2013-10-16 MED ORDER — ARMODAFINIL 250 MG PO TABS: 250.0000 mg | ORAL_TABLET | Freq: Every day | ORAL | Status: DC

## 2013-10-17 NOTE — Telephone Encounter (Signed)
Rx has been signed and faxed  

## 2013-11-07 ENCOUNTER — Telehealth: Payer: Self-pay | Admitting: Diagnostic Neuroimaging

## 2013-11-07 NOTE — Telephone Encounter (Signed)
Tanya Stanley from [facility] calling to state that [employer] has lost patient's package containing her Nuvigil medication and she keeps calling them but they can't do anything to help her. Patient is requesting a replacement package to be sent to her but prior authorization is needed again. Reference # is [id number]

## 2013-11-07 NOTE — Telephone Encounter (Signed)
We have previously requested a prior auth.  I called the pharmacy back.  Spoke with Fayrene FearingJames.  He said they do not need a PA, only auth to resend the med.  I gave verbal auth to resend med.  They will process this Rx today.

## 2013-11-08 ENCOUNTER — Telehealth: Payer: Self-pay

## 2013-11-08 NOTE — Telephone Encounter (Signed)
[facility] sent us a letter saying no Prior Berkley Harveyuth is required for Nuvigil, as it is a formulary item that IS covered under the plan.  Ref # [id number]

## 2014-04-03 ENCOUNTER — Other Ambulatory Visit: Payer: Self-pay | Admitting: Physician Assistant

## 2014-04-03 ENCOUNTER — Ambulatory Visit
Admission: RE | Admit: 2014-04-03 | Discharge: 2014-04-03 | Disposition: A | Discharge status: Home / Self Care | Payer: 59 | Source: Ambulatory Visit | Attending: Physician Assistant | Admitting: Physician Assistant

## 2014-04-03 DIAGNOSIS — M5489 Other dorsalgia: Secondary | ICD-10-CM

## 2014-04-03 DIAGNOSIS — M25552 Pain in left hip: Secondary | ICD-10-CM

## 2014-05-20 ENCOUNTER — Telehealth: Payer: Self-pay | Admitting: Neurology

## 2014-05-20 DIAGNOSIS — G4710 Hypersomnia, unspecified: Secondary | ICD-10-CM

## 2014-05-20 DIAGNOSIS — G4730 Sleep apnea, unspecified: Secondary | ICD-10-CM

## 2014-05-20 NOTE — Telephone Encounter (Signed)
The patent has not been seen in over 1 year.  I called back.  Got no answer.  Left message. Asked that she please call us back to schedule appt.

## 2014-05-20 NOTE — Telephone Encounter (Signed)
Patient has appointment scheduled 10/19, questioning if she could samples to last until appointment.  Please call and advise.

## 2014-05-20 NOTE — Telephone Encounter (Signed)
I called back and spoke with the patient.  She would like a one month Rx sent to local [facility] and will ask for a regualr Rx to send to mail order when she is in for OV.  Request sent to provider for review

## 2014-05-20 NOTE — Telephone Encounter (Signed)
Patient requesting Rx refill request for Armodafinil (NUVIGIL) 250 MG tablet.  Patient has only two pills left.  Questioning if she could get samples until Rx is refilled.  Please call and advise.

## 2014-05-21 MED ORDER — ARMODAFINIL 250 MG PO TABS: 250.0000 mg | ORAL_TABLET | Freq: Every day | ORAL | Status: DC

## 2014-05-27 ENCOUNTER — Ambulatory Visit (INDEPENDENT_AMBULATORY_CARE_PROVIDER_SITE_OTHER): Payer: 59 | Admitting: Neurology

## 2014-05-27 ENCOUNTER — Encounter: Payer: Self-pay | Admitting: Neurology

## 2014-05-27 VITALS — BP 138/86 | HR 76 | Temp 98.1°F | Resp 14 | Ht 65.0 in | Wt 245.0 lb

## 2014-05-27 DIAGNOSIS — G4730 Sleep apnea, unspecified: Secondary | ICD-10-CM

## 2014-05-27 DIAGNOSIS — G4733 Obstructive sleep apnea (adult) (pediatric): Secondary | ICD-10-CM

## 2014-05-27 DIAGNOSIS — Z9989 Dependence on other enabling machines and devices: Secondary | ICD-10-CM

## 2014-05-27 DIAGNOSIS — G4710 Hypersomnia, unspecified: Secondary | ICD-10-CM

## 2014-05-27 DIAGNOSIS — R5382 Chronic fatigue, unspecified: Secondary | ICD-10-CM

## 2014-05-27 HISTORY — DX: Dependence on other enabling machines and devices: Z99.89

## 2014-05-27 MED ORDER — ARMODAFINIL 250 MG PO TABS: 250.0000 mg | ORAL_TABLET | Freq: Every day | ORAL | Status: DC

## 2014-05-27 NOTE — Progress Notes (Signed)
[facility]   Provider:  Larey Seat, M D  Referring Provider: Hulen Shouts, * Primary Care Physician:  Cleda Mccreedy  Chief Complaint  Patient presents with  . RV sleep    RM 11 Alone    HPI:  Tanya Stanley is a 48 y.o. female  Is seen here as a referral  from Dr. Jonny Ruiz for sleep apnea and CPAP compliance.   Tanya Stanley is here today for her CPAP compliance visit. The patient had a polysomnogram which revealed  mild apnea. The patient was diagnosed with idiopathic hypersomnia but she clinically had states symptoms of narcolepsy.  She was being weaned of anti-depressants  preparation for her MSLT- the result was abnormal as the sleep latency was only 4.8 minutes, but there were no SOREMs .  She has continued to use CPAP faithfully she actually uses a BiPAP with 10 cm water pressure inspiratory and 6 cm pressure expiratory. The AHI has been 0.8 or less for the last 30 days the patient's compliance is excellent. She is using CPAP on average 6 hours a day within the last 90 days. 80% use over 4 hors nightly .  She has isolated nights with high air leak is but most nights air leakage is mild.  The patient has treated her  residual hypersomnia with modafinil. She's here today for yearly refill.  How likely are you to doze in the following situations: 0 = not likely, 1 = slight chance, 2 = moderate chance, 3 = high chance  Sitting and Reading? Watching Television? Sitting inactive in a public place (theater or meeting)? Lying down in the afternoon when circumstances permit? Sitting and talking to someone? Sitting quietly after lunch without alcohol? In a car, while stopped for a few minutes in traffic? As a passenger in a car for an hour without a break?  Total = 6   Sleep habits: patient goes to bed at 8 PM and rises 4 AM, usually needs an alarm. Unfragmented sleep, no vivid dreams. She is on Lexapro and wellbutrin.      Review of Systems: Out  of a complete 14 system review, the patient complains of only the following symptoms, and all other reviewed systems are negative. Obesity, hypersomnia on Nuvigil and on CPAP /BiPAP.  Epworth score 6 , Fatigue severity score 49 , depression score 3 points.    History   Social History  . Marital Status: Single    Spouse Name: N/A    Number of Children: 0  . Years of Education: BA   Occupational History  . 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 Smoking status: Current Every Day Smoker -- 1.00 packs/day for 20 years  . Smokeless tobacco: Never Used  . Alcohol Use: Yes     Comment: 5 mixed drinks weekly  . Drug Use: No  . Sexual Activity: Not on file   Other Topics Concern  . Not on file   Social History Narrative   Pt lives at home alone.   Caffeine Use: 1-2 cups daily   Right handed.   FT- QC specialist   BS Chemistry    Family History  Problem Relation Age of Onset  . Ovarian cancer Mother     Past Medical History  Diagnosis Date  . EDS (Ehlers-Danlos syndrome)   . Migraine   . Anxiety and depression   . LBP (low back pain)   . Obstructive apnea  PSG 11-06-12, AHI 27.9 , RDI 36.9 , bipap  10/6 cm   . Hypersomnia, persistent     continued Epworth 20 and higher on BIPAP>   . Migraine     Past Surgical History  Procedure Laterality Date  . Toe surgery  1985    ingrown toenail  . Knee surgery  1989  . Vaginal hysterectomy  2005    Current Outpatient Prescriptions  Medication Sig Dispense Refill  . ALPRAZolam (XANAX) 0.5 MG tablet Take 1 tablet by mouth as needed.      . Armodafinil (NUVIGIL) 250 MG tablet Take 1 tablet (250 mg total) by mouth daily.  30 tablet  1  . buPROPion (WELLBUTRIN XL) 300 MG 24 hr tablet Take 300 mg by mouth daily.      . calcium-vitamin D (OSCAL) 250-125 MG-UNIT per tablet Take 2 tablets by mouth daily.      Marland Kitchen co-enzyme Q-10 50 MG capsule Take 50 mg by mouth daily.      Marland Kitchen escitalopram (LEXAPRO) 10  MG tablet Take 10 mg by mouth daily.       . ferrous sulfate 325 (65 FE) MG tablet Take 325 mg by mouth daily with breakfast.      . Multiple Vitamins-Minerals (MULTIVITAMIN PO) Take 1 tablet by mouth daily.      . Omega-3 Fatty Acids (FISH OIL) 1000 MG CAPS Take by mouth.      . valACYclovir (VALTREX) 1000 MG tablet Take 1,000 mg by mouth as needed.      . Vitamin B Complex-C CAPS Take 1 capsule by mouth daily.      . fluticasone (FLONASE) 50 MCG/ACT nasal spray Place 2 sprays into the nose daily.       No current facility-administered medications for this visit.    Allergies as of 05/27/2014  . (No Known Allergies)    Vitals: BP 138/86  Pulse 76  Temp(Src) 98.1 F (36.7 C) (Oral)  Resp 14  Ht 5\' 5"  (1.651 m)  Wt 245 lb (111.131 kg)  BMI 40.77 kg/m2 Last Weight:  Wt Readings from Last 1 Encounters:  05/27/14 245 lb (111.131 kg)       Last Height:   Ht Readings from Last 1 Encounters:  05/27/14 5\' 5"  (1.651 m)    Physical exam:  General: The patient is awake, alert and appears not in acute distress. The patient is well groomed. Head: Normocephalic, atraumatic. Neck is supple. Mallampati 3  neck circumference:15.5 . Nasal airflow  unrestricted, TMJ is not evident . Retrognathia is not seen.  Cardiovascular:  Regular rate and rhythm, without  murmurs or carotid bruit, and without distended neck veins. Respiratory: Lungs are clear to auscultation. Skin:  Without evidence of edema, or rash Trunk: BMI is  elevated and patient  has normal posture.  Neurologic exam : The patient is awake and alert, oriented to place and time.   Memory subjective  described as intact.  There is a normal attention span & concentration ability.  Speech is fluent without dysarthria, dysphonia or aphasia.  Mood and affect are appropriate.  Cranial nerves: Pupils are equal and briskly reactive to light. Funduscopic exam without evidence of pallor or edema.  Extraocular movements  in vertical and  horizontal planes intact and without nystagmus. Visual fields by finger perimetry are intact. Hearing to finger rub intact. Facial sensation intact to fine touch.  Facial motor strength is symmetric and tongue and uvula move midline.  Motor exam:  Normal tone, muscle  bulk and symmetric, strength in all extremities.  Sensory:  Fine touch, pinprick and vibration were tested in all extremities. Proprioception is normal.  Coordination: Rapid alternating movements in the fingers/hands is normal.  Finger-to-nose maneuver normal without evidence of ataxia, dysmetria or tremor.  Gait and station: Patient walks without assistive device and is able unassisted to climb up to the exam table.  Strength within normal limits. Stance is stable and normal. Tandem gait is unfragmented. Romberg testing is negative.  Deep tendon reflexes: in the upper and lower extremities are symmetric and intact. Babinski maneuver downgoing.   Assessment:  After physical and neurologic examination, review of laboratory studies, imaging, neurophysiology testing and pre-existing records, assessment is;  Patient with a diagnosis of OSA on BIPAP and residual hypersomnia. Her Epworth score is now 6 points, but her fatigue remained high. She is on SSRI.  I ordered today a narcolepsy HLA, and refilled on NUVIGIL. No sleep paralysis, no dream intrusion, no cataplexy reported, just extreme fatigue and previously high Epworth ( before BiPAP) .      The patient was advised of the nature of the diagnosed sleep disorder , the treatment options and risks for general a health and wellness arising from not treating the condition. Visit duration was 45 minutes.   Plan:  Treatment plan and additional workup :  Weight loss to be implemented, this is her choice to treat OSA/ and the need for CPAP at it's root. Otherwise stay on CPAP.   HLA narcolepsy panel      Larey Seat MD  05/27/2014

## 2014-05-27 NOTE — Patient Instructions (Signed)
Narcolepsy Narcolepsy is a nervous system disorder that causes daytime sleepiness and sudden bouts of irresistible sleep during the day (sleep attacks). Many people with narcolepsy live with extreme daytime sleepiness for many years before being diagnosed and treated. Narcolepsy is a lifelong (chronic) disorder.  You normally go through cycles when you sleep. When your sleep becomes deeper, you have less body movement, and you start dreaming. This type of deep sleep should happen after about 90 minutes of lighter sleep. Deep sleep is called rapid eye movement (REM) sleep. When you have narcolepsy, REM is not well regulated. It can happen as soon as you fall asleep, and components of REM sleep can occur during the day when you are awake. Uncontrolled REM sleep causes symptoms of narcolepsy. CAUSES Narcolepsy may be caused by an abnormality with a chemical messenger (neurotransmitter) in your brain that controls your sleep and wake cycles. Most people with narcolepsy have low levels of the neurotransmitter hypocretin. Hypocretin is important for controlling wakefulness.  A hypocretin imbalance may be caused by abnormal genes that are passed down through families. It may also develop if the body's defense system (immune system) mistakenly attacks the brain cells that produce hypocretin (autoimmune disease). RISK FACTORS You may be at higher risk for narcolepsy if you have a family history of the disease. Other risk factors that may contribute include:  Injuries, tumors, or infections in the areas of the brain that control sleep.  Exposure to toxins.  Stress.  Hormones produced during puberty and menopause.  Poor sleep habits. SIGNS AND SYMPTOMS  Symptoms of narcolepsy can start at any age but usually begin when people are between the ages of 7 and 25 years. There are four major symptoms. Not everyone with narcolepsy will have all four.   Excessive daytime sleepiness. This is the most common symptom  and is usually the first symptom you will notice.  You may feel as if you are in a mental fog.  Daytime sleepiness may severely affect your performance at work or school.  You may fall asleep during a conversation or while eating dinner.  Sudden loss of muscle tone (cataplexy). You do not lose consciousness, but you may suddenly lose muscle control. When this occurs, your speech may become slurred, or your knees may buckle. This symptom is usually triggered by surprise, anger, or laughter.  Sleep paralysis. You may lose the ability to speak or move just as you start to fall asleep or wake up. You will be aware of the paralysis. It usually lasts for just a few seconds or minutes.  Vivid hallucinations. These may occur with sleep paralysis. The hallucinations are like having bizarre or frightening dreams while you are still awake. Other symptoms may include:   Trouble staying asleep at night (insomnia).  Restless sleep.  Feeling a strong urge to get up at night to smoke or eat. DIAGNOSIS  Your health care provider can diagnose narcolepsy based on your symptoms and the results of two diagnostic tests. You may also be asked to keep a sleep diary for several weeks. You may need the tests if you have had daytime sleepiness for at least 3 months. The two tests are:   A polysomnogram to find out how well your REM sleep is regulated at night. This test is an overnight sleep study. It measures your heart rate, breathing, movement, and brain waves.  A multiple sleep latency test (MSLT) to find out how well your REM sleep is regulated during the day. This   is a daytime sleep study. You may need to take several naps during the day. This test also measures your heart rate, breathing, movement, and brain waves. TREATMENT  There is no cure for narcolepsy, but treatment can be very effective in helping manage the condition. Treatment may include:  Lifestyle and sleeping strategies to help cope with the  condition.  Medicines. These may include:  Stimulant medicines to fight daytime sleepiness.  Antidepressant medicines to treat cataplexy.  Sodium oxybate. This is a strong sedative that you take at night. It can help daytime sleepiness and cataplexy. HOME CARE INSTRUCTIONS  Take all medicines as directed by your health care provider.  Follow these sleep practices:  Try to get about 8 hours of sleep every night.  Go to sleep and get up close to the same time every day.  Keep your bedroom dark, quiet, and comfortable.  Schedule short naps for when you feel sleepiest during the day. Tell your employer or teachers that you have narcolepsy. You may be able to adjust your schedule to include time for naps.  Try to get at least 20 minutes of exercise every day. This will help you sleep better at night and reduce daytime sleepiness.  Do not drink alcohol or caffeinated beverages within 4-5 hours of bedtime.  Do not eat a heavy meal before bedtime. Eat at about the same times every day.  Do not smoke.  Do not drive if you are sleepy or have untreated narcolepsy.  Do not swim or go out on the water without a life jacket. SEEK MEDICAL CARE IF: Your medicines are not controlling your narcolepsy symptoms. SEEK IMMEDIATE MEDICAL CARE IF:  You hurt yourself during a sleep attack or an attack of cataplexy.  You have chest pain or trouble breathing. Document Released: 07/16/2002 Document Revised: 12/10/2013 Document Reviewed: 07/18/2013 ExitCare Patient Information 2015 [employer]. This information is not intended to replace advice given to you by your health care provider. Make sure you discuss any questions you have with your health care provider.  

## 2014-06-18 ENCOUNTER — Telehealth: Payer: Self-pay | Admitting: *Deleted

## 2014-06-18 NOTE — Telephone Encounter (Signed)
I called pt and relayed the HLA typing , Narcolepsy result (normal) to her.   Mailed copy of result to her home address.

## 2014-06-25 ENCOUNTER — Other Ambulatory Visit: Payer: Self-pay | Admitting: Physician Assistant

## 2014-06-25 DIAGNOSIS — R519 Headache, unspecified: Secondary | ICD-10-CM

## 2014-06-25 DIAGNOSIS — R51 Headache: Principal | ICD-10-CM

## 2014-06-26 ENCOUNTER — Ambulatory Visit
Admission: RE | Admit: 2014-06-26 | Discharge: 2014-06-26 | Disposition: A | Discharge status: Home / Self Care | Payer: 59 | Source: Ambulatory Visit | Attending: Physician Assistant | Admitting: Physician Assistant

## 2014-06-26 DIAGNOSIS — R519 Headache, unspecified: Secondary | ICD-10-CM

## 2014-06-26 DIAGNOSIS — R51 Headache: Principal | ICD-10-CM

## 2014-07-09 ENCOUNTER — Ambulatory Visit (INDEPENDENT_AMBULATORY_CARE_PROVIDER_SITE_OTHER): Payer: 59 | Admitting: Neurology

## 2014-07-09 ENCOUNTER — Encounter: Payer: Self-pay | Admitting: Neurology

## 2014-07-09 VITALS — BP 148/91 | HR 89 | Ht 65.0 in | Wt 211.0 lb

## 2014-07-09 VITALS — BP 145/87 | HR 76

## 2014-07-09 DIAGNOSIS — G43809 Other migraine, not intractable, without status migrainosus: Secondary | ICD-10-CM

## 2014-07-09 DIAGNOSIS — G43009 Migraine without aura, not intractable, without status migrainosus: Secondary | ICD-10-CM

## 2014-07-09 DIAGNOSIS — G43909 Migraine, unspecified, not intractable, without status migrainosus: Secondary | ICD-10-CM | POA: Insufficient documentation

## 2014-07-09 MED ORDER — SUMATRIPTAN 20 MG/ACT NA SOLN: 20.0000 mg | NASAL | Status: AC | PRN

## 2014-07-09 MED ORDER — ALPRAZOLAM 0.5 MG PO TABS: 0.2500 mg | ORAL_TABLET | Freq: Two times a day (BID) | ORAL | Status: DC | PRN

## 2014-07-09 MED ORDER — VALPROATE SODIUM 500 MG/5ML IV SOLN
1000.0000 mg | INTRAVENOUS | Status: AC
  Administered 2014-07-09: 1000 mg via INTRAVENOUS

## 2014-07-09 MED ORDER — KETOROLAC TROMETHAMINE 30 MG/ML IJ SOLN
30.0000 mg | Freq: Once | INTRAMUSCULAR | Status: AC
  Administered 2014-07-09: 30 mg via INTRAVENOUS

## 2014-07-09 NOTE — Progress Notes (Signed)
Patient here seeing Dr. Lucia GaskinsAhern.  Order for Depacon 1000mg  IV and Toradol 30mg  IV push. Patient to treatment room.  Patient headache pain level 2.   IV started in right  Outer AC, good blood return, 24g angiocath.   Depacon 1000mg /100cc 0.9% NaCl started at 1420.  Patient pain level down to 1.  Toradol 30mg  given IV push.   Upon completion patient pain level down to 0.5.   IV discontinued and removed at 1649.  Patient to check out in NAD.

## 2014-07-09 NOTE — Progress Notes (Addendum)
[facility]    Provider:  Dr Lucia GaskinsAhern Referring Provider: Milus Heightedmon, Noelle, PA-C Primary Care Physician:  REDMON,NOELLE, PA-C  CC:  Migraine  HPI:  Tanya AmendDebra Stanley is a 48 y.o. female here as a referral from Dr. Sherlyn Lickedmon for Migraines. They started at least 15 years ago. Had a migraine even as a child triggered by a smell. She gets them behind both eyes, throbbing. Light is a trigger. Weather is a trigger as well. Fluorescent light is a trigger.  Can be severe 10/10. +nausea and vomiting. Can go up to 5 straight days. Needs a dark room. Tried Imitrex, relpax, toradol, tramadol, flexeril. Never tried Botox. She has a bad one every month at most. Her blood pressure goes up because she has so much anxiety when she has a bad migraine. Denies focal symptoms durin the migraine. No vision changes. No hearing changes. She has excessive daytime fatigue and follows with Dr. Vickey Hugerohmeier. She has had a headache for days now and needs some relief.  No aura. Has muscular neck pain with the headaches.   Personally reviewed CT of the brain, no ischmic areas, no masses or edema, normal.  Review of Systems: Patient complains of symptoms per HPI as well as the following symptoms: appetite change, flushing, apnea, neck pain and stiffness, depression, anxiety. Pertinent negatives per HPI. All others negative.   History   Social History  . Marital Status: Single    Spouse Name: Tanya Stanley    Number of Children: 0  . Years of Education: BA   Occupational History  . 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 Smoking status: Current Every Day Smoker -- 1.00 packs/day for 20 years  . Smokeless tobacco: Never Used  . Alcohol Use: 0.0 oz/week    0 Not specified per week     Comment: 5 mixed drinks weekly  . Drug Use: No  . Sexual Activity: Not on file   Other Topics Concern  . Not on file   Social History Narrative   Pt lives at home alone. Single. And with her dog.   Caffeine Use: 1-2 cups daily   Right handed.   FT- QC specialist   BS Chemistry    Family History  Problem Relation Age of Onset  . Ovarian cancer Mother     Past Medical History  Diagnosis Date  . EDS (Ehlers-Danlos syndrome)   . Migraine   . Anxiety and depression   . LBP (low back pain)   . Obstructive apnea     PSG 11-06-12, AHI 27.9 , RDI 36.9 , bipap  10/6 cm   . Hypersomnia, persistent     continued Epworth 20 and higher on BIPAP>   . Migraine   . BiPAP (biphasic positive airway pressure) dependence 05/27/2014    Past Surgical History  Procedure Laterality Date  . Toe surgery  1985    ingrown toenail  . Knee surgery  1989  . Vaginal hysterectomy  2005    Current Outpatient Prescriptions  Medication Sig Dispense Refill  . ALPRAZolam (XANAX) 0.5 MG tablet Take 1 tablet by mouth as needed.    . Armodafinil (NUVIGIL) 250 MG tablet Take 1 tablet (250 mg total) by mouth daily. 30 tablet 5  . buPROPion (WELLBUTRIN XL) 300 MG 24 hr tablet Take 300 mg by mouth daily.    . calcium-vitamin D (OSCAL) 250-125 MG-UNIT per tablet Take 2 tablets by mouth daily.    Marland Kitchen. escitalopram (LEXAPRO)  10 MG tablet Take 10 mg by mouth daily.     . ferrous sulfate 325 (65 FE) MG tablet Take 325 mg by mouth daily with breakfast.    . fluticasone (FLONASE) 50 MCG/ACT nasal spray Place 2 sprays into the nose daily.    . Multiple Vitamins-Minerals (MULTIVITAMIN PO) Take 1 tablet by mouth daily.    . valACYclovir (VALTREX) 1000 MG tablet Take 1,000 mg by mouth as needed.    . Vitamin B Complex-C CAPS Take 1 capsule by mouth daily.     No current facility-administered medications for this visit.    Allergies as of 07/09/2014  . (No Known Allergies)    Vitals: BP 148/91 mmHg  Pulse 89  Ht 5\' 5"  (1.651 m)  Wt 211 lb (95.709 kg)  BMI 35.11 kg/m2 Last Weight:  Wt Readings from Last 1 Encounters:  07/09/14 211 lb (95.709 kg)   Last Height:   Ht Readings from Last 1 Encounters:  07/09/14  5\' 5"  (1.651 m)   Physical exam: Exam: Gen: NAD, conversant, well nourised, obese, well groomed                     CV: RRR, no MRG. No Carotid Bruits. No peripheral edema, warm, nontender Eyes: Conjunctivae clear without exudates or hemorrhage  Neuro: Detailed Neurologic Exam  Speech:    Speech is normal; fluent and spontaneous with normal comprehension.  Cognition:    The patient is oriented to person, place, and time;     recent and remote memory intact;     language fluent;     normal attention, concentration,     fund of knowledge Cranial Nerves:    The pupils are equal, round, and reactive to light. The fundi are normal and spontaneous venous pulsations are present. Visual fields are full to finger confrontation. Extraocular movements are intact. Trigeminal sensation is intact and the muscles of mastication are normal. The face is symmetric. The palate elevates in the midline. Voice is normal. Shoulder shrug is normal. The tongue has normal motion without fasciculations.   Coordination:    Normal finger to nose and heel to shin.   Gait:    Heel-toe  are normal.   Motor Observation:    No asymmetry, no atrophy, and no involuntary movements noted. Tone:    Normal muscle tone.    Posture:    Posture is normal. normal erect    Strength:    Strength is V/V in the upper and lower limbs.      Sensation: intact     Reflex Exam:  DTR's:    Deep tendon reflexes in the upper and lower extremities are symmetric bilaterally.   Toes:    The toes are downgoing bilaterally.   Clonus:    Clonus is absent.       Assessment/Plan:  48 year old female with monthly migraines without aura, not intractable. Has ha done for a few days. Neuro exam is non focal. Imitrex/relpax have not helped. She has significant nausea and vomiting. She has never been on anything else. She does not have frequent migraines, daily preventative not needed. But needs to be able to abort the headache.  Discussed the following:  To prevent or relieve headaches, try the following: Cool Compress. Lie down and place a cool compress on your head.  Avoid headache triggers. If certain foods or odors seem to have triggered your migraines in the past, avoid them. A headache diary might help you  identify triggers.  Include physical activity in your daily routine. Try a daily walk or other moderate aerobic exercise.  Manage stress. Find healthy ways to cope with the stressors, such as delegating tasks on your to-do list.  Practice relaxation techniques. Try deep breathing, yoga, massage and visualization.  Eat regularly. Eating regularly scheduled meals and maintaining a healthy diet might help prevent headaches. Also, drink plenty of fluids.  Follow a regular sleep schedule. Sleep deprivation might contribute to headaches Consider biofeedback. With this mind-body technique, you learn to control certain bodily functions - such as muscle tension, heart rate and blood pressure - to prevent headaches or reduce headache pain.    Proceed to emergency room if you experience new or worsening symptoms or symptoms do not resolve, if you have new neurologic symptoms or if headache is severe, or for any concerning symptom.    - Will order initrex Nasal spray(orals not working, possibly due to non absorption, she had significant nausea and vomiting) however this also may worsen her nausea and vomiting. Think Zecuity would be best for patient, will order - Xanax 0.25mg  SPARINGLY during migraines when anxiety is making it worse. Discussed thoroughly with patient, she is being treated by Dr. Vickey Huger for excessive daytime fatigue and this medication will worsen her condition. Only as needed for SEVERE anxiety caused by a migraine. Patient expressed understanding.  - follow up in 3 months - will give a migraine cocktail today, depacon 1g and toradol.  Naomie Dean, MD  [facility] [address] [place], [place] [address]  Phone [phone] Fax [phone] Lesly Dukes

## 2014-07-09 NOTE — Patient Instructions (Addendum)
Overall you are doing fairly well but I do want to suggest a few things today:   Remember to drink plenty of fluid, eat healthy meals and do not skip any meals. Try to eat protein with a every meal and eat a healthy snack such as fruit or nuts in between meals. Try to keep a regular sleep-wake schedule and try to exercise daily, particularly in the form of walking, 20-30 minutes a day, if you can.   As far as your medications are concerned, I would like to suggest:  Imitrex nasal spray. Spray once in nostril at the onset of your headache. If it does not improve the symptoms please take one additional spray. Do not take more then twice in 24hrs. Do not take use more then 2 to 3 days in a week. Do not use in the same day as Zecuity Zecuity patch as directed Xanax 0.25mg  for severe headache with anxiety. Do not take unless needed for headache and pain as it can worsen daytime somnolence.   I would like to see you back in 3 months, sooner if we need to. Please call us with any interim questions, concerns, problems, updates or refill requests.   Please also call us for any test results so we can go over those with you on the phone.  My clinical assistant and will answer any of your questions and relay your messages to me and also relay most of my messages to you.   Our phone number is [phone]. We also have an after hours call service for urgent matters and there is a physician on-call for urgent questions. For any emergencies you know to call 911 or go to the nearest emergency room

## 2014-07-10 NOTE — Progress Notes (Signed)
I agree with the assessment and plan as directed by NP .The patient is known to me .   Darik Massing, MD  

## 2014-07-15 ENCOUNTER — Other Ambulatory Visit (HOSPITAL_COMMUNITY): Payer: Self-pay | Admitting: Physician Assistant

## 2014-07-15 DIAGNOSIS — Z1231 Encounter for screening mammogram for malignant neoplasm of breast: Secondary | ICD-10-CM

## 2014-08-16 ENCOUNTER — Inpatient Hospital Stay: Admit: 2014-08-16 | Discharge: 2014-08-16 | Disposition: A | Discharge status: Home / Self Care | Payer: Self-pay

## 2014-08-16 ENCOUNTER — Ambulatory Visit (HOSPITAL_COMMUNITY)
Admission: RE | Admit: 2014-08-16 | Discharge: 2014-08-16 | Disposition: A | Discharge status: Home / Self Care | Payer: 59 | Source: Ambulatory Visit | Attending: Physician Assistant | Admitting: Physician Assistant

## 2014-08-16 DIAGNOSIS — Z1231 Encounter for screening mammogram for malignant neoplasm of breast: Secondary | ICD-10-CM | POA: Diagnosis present

## 2014-08-27 ENCOUNTER — Ambulatory Visit (INDEPENDENT_AMBULATORY_CARE_PROVIDER_SITE_OTHER): Payer: 59 | Admitting: Adult Health

## 2014-08-27 ENCOUNTER — Other Ambulatory Visit: Payer: Self-pay

## 2014-08-27 ENCOUNTER — Encounter: Payer: Self-pay | Admitting: Neurology

## 2014-08-27 ENCOUNTER — Encounter: Payer: Self-pay | Admitting: Adult Health

## 2014-08-27 VITALS — BP 119/72 | HR 84 | Ht 65.0 in | Wt 242.0 lb

## 2014-08-27 DIAGNOSIS — G4733 Obstructive sleep apnea (adult) (pediatric): Secondary | ICD-10-CM

## 2014-08-27 DIAGNOSIS — R5382 Chronic fatigue, unspecified: Secondary | ICD-10-CM

## 2014-08-27 DIAGNOSIS — G4730 Sleep apnea, unspecified: Secondary | ICD-10-CM

## 2014-08-27 DIAGNOSIS — G4710 Hypersomnia, unspecified: Secondary | ICD-10-CM

## 2014-08-27 DIAGNOSIS — Z9989 Dependence on other enabling machines and devices: Principal | ICD-10-CM

## 2014-08-27 MED ORDER — SODIUM OXYBATE 500 MG/ML PO SOLN: ORAL | Status: DC

## 2014-08-27 NOTE — Progress Notes (Signed)
PATIENT: Tanya Stanley DOB: 11/24/1965  REASON FOR VISIT: follow up for OSA on CPAP, hypersomnia, chronic fatigue HISTORY FROM: patient  HISTORY OF PRESENT ILLNESS: Ms. Tanya Stanley is a 49 year old female with a history of sleep apnea on CPAP and hypersomnia. She returns today for a 90 day compliance download. She brought her machine with her today and the reports shows an AHI of 1.0 with IPAP 10cmH20 and EPAP 6cmH20, uses her machine for 7 hours and 1 minute a night, with 90% compliance. Her Epworth score is 13 points was previously 6 points. Her fatigue severity score is 52  was previously 49.   She uses nuvigil and reports that is helps but she still feels that she could take a nap about two hours after taking it. She states that she is extremely fatigued during the day. She is inquiring if there is something better to take. She did have HLA testing that was negative and her sleep study for narcolepsy was negative. The patient was recently seen by Dr. Jaynee Eagles for Migraines. She was given Imitrex nasal spray and xanax to use if anxiety exacerbated her migraine.   HISTORY: Tanya Stanley is a 49 y.o. female Is seen here as a referral from Dr. Jonny Ruiz for sleep apnea and CPAP compliance. Mrs. Mclaren is here today for her CPAP compliance visit. The patient had a polysomnogram which revealed mild apnea.The patient was diagnosed with idiopathic hypersomnia but she clinically had states symptoms of narcolepsy.  She was being weaned of anti-depressants preparation for her MSLT- the result was abnormal as the sleep latency was only 4.8 minutes, but there were no SOREMs . She has continued to use CPAP faithfully she actually uses a BiPAP with 10 cm water pressure inspiratory and 6 cm pressure expiratory. The AHI has been 0.8 or less for the last 30 days the patient's compliance is excellent. She is using CPAP on average 6 hours a day within the last 90 days. 80% use over 4 hors nightly. She has isolated nights  with high air leak is but most nights air leakage is mild.  The patient has treated her residual hypersomnia with modafinil. She's here today for yearly refill.  REVIEW OF SYSTEMS: Out of a complete 14 system review of symptoms, the patient complains only of the following symptoms, and all other reviewed systems are negative.  Appetite change, fatigue, unexpected weight changes, excessive eating, apnea, daytime sleepiness, back pain, depression, nervous/anxious  ALLERGIES: No Known Allergies  HOME MEDICATIONS: Outpatient Prescriptions Prior to Visit  Medication Sig Dispense Refill  . ALPRAZolam (XANAX) 0.5 MG tablet Take 0.5 tablets (0.25 mg total) by mouth 2 (two) times daily as needed for anxiety. Take at start of migraine for anxiety. May worsen daytime fatigue and somnolence, do not take regularly 15 tablet 3  . Armodafinil (NUVIGIL) 250 MG tablet Take 1 tablet (250 mg total) by mouth daily. 30 tablet 5  . buPROPion (WELLBUTRIN XL) 300 MG 24 hr tablet Take 300 mg by mouth daily.    . calcium-vitamin D (OSCAL) 250-125 MG-UNIT per tablet Take 2 tablets by mouth daily.    Marland Kitchen escitalopram (LEXAPRO) 10 MG tablet Take 10 mg by mouth daily.     . ferrous sulfate 325 (65 FE) MG tablet Take 325 mg by mouth daily with breakfast.    . fluticasone (FLONASE) 50 MCG/ACT nasal spray Place 2 sprays into the nose daily.    . Multiple Vitamins-Minerals (MULTIVITAMIN PO) Take 1 tablet by mouth daily.    Marland Kitchen  SUMAtriptan (IMITREX) 20 MG/ACT nasal spray Place 1 spray (20 mg total) into the nose every 2 (two) hours as needed for migraine or headache. May repeat in 2 hours if headache persists or recurs. 1 Inhaler 6  . valACYclovir (VALTREX) 1000 MG tablet Take 1,000 mg by mouth as needed.    . Vitamin B Complex-C CAPS Take 1 capsule by mouth daily.     Facility-Administered Medications Prior to Visit  Medication Dose Route Frequency Provider Last Rate Last Dose  . valproate (DEPACON) 1,000 mg in sodium  chloride 0.9 % 100 mL IVPB  1,000 mg Intravenous Continuous Melvenia Beam, MD   Stopped at 07/09/14 1649    PAST MEDICAL HISTORY: Past Medical History  Diagnosis Date  . EDS (Ehlers-Danlos syndrome)   . Migraine   . Anxiety and depression   . LBP (low back pain)   . Obstructive apnea     PSG 11-06-12, AHI 27.9 , RDI 36.9 , bipap  10/6 cm   . Hypersomnia, persistent     continued Epworth 20 and higher on BIPAP>   . Migraine   . BiPAP (biphasic positive airway pressure) dependence 05/27/2014    PAST SURGICAL HISTORY: Past Surgical History  Procedure Laterality Date  . Toe surgery  1985    ingrown toenail  . Knee surgery  1989  . Vaginal hysterectomy  2005    FAMILY HISTORY: Family History  Problem Relation Age of Onset  . Ovarian cancer Mother     SOCIAL HISTORY: History   Social History  . Marital Status: Single    Spouse Name: N/A    Number of Children: 0  . Years of Education: BA   Occupational History  . 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 Smoking status: Current Every Day Smoker -- 1.00 packs/day for 20 years  . Smokeless tobacco: Never Used  . Alcohol Use: 0.0 oz/week    0 Not specified per week     Comment: 5 mixed drinks weekly  . Drug Use: No  . Sexual Activity: Not on file   Other Topics Concern  . Not on file   Social History Narrative   Pt lives at home alone. Single. And with her dog.   Caffeine Use: 1-2 cups daily   Right handed.   FT- QC specialist   BS Chemistry      PHYSICAL EXAM  Filed Vitals:   08/27/14 1403  Height: _0  (1.651 m)  Weight: 242 lb (109.77 kg)   Body mass index is 40.27 kg/(m^2).  Generalized: Well developed, in no acute distress   Neurological examination  Mentation: Alert oriented to time, place, history taking. Follows all commands speech and language fluent Cranial nerve II-XII: Pupils were equal round reactive to light. Extraocular movements were full, visual field  were full on confrontational test. Facial sensation and strength were normal. Uvula tongue midline. Head turning and shoulder shrug  were normal and symmetric. Motor: The motor testing reveals 5 over 5 strength of all 4 extremities. Good symmetric motor tone is noted throughout.  Sensory: Sensory testing is intact to soft touch on all 4 extremities. No evidence of extinction is noted.  Coordination: Cerebellar testing reveals good finger-nose-finger and heel-to-shin bilaterally.  Gait and station: Gait is normal.       DIAGNOSTIC DATA (LABS, IMAGING, TESTING) - I reviewed patient records, labs, notes, testing and imaging myself where available.     ASSESSMENT AND PLAN 48  y.o. year old female  has a past medical history of EDS (Ehlers-Danlos syndrome); Migraine; Anxiety and depression; LBP (low back pain); Obstructive apnea; Hypersomnia, persistent; Migraine; and BiPAP (biphasic positive airway pressure) dependence (05/27/2014). here with:  1. Obstructive sleep apnea on CPAP 2.hypersomnia 3. Chronic fatigue   The patient's CPAP download is excellent. She has good compliance. The patient continues to take Nuvigil. She reports that this is beneficial however she continues to have daytime sleepiness as well as chronic fatigue. Her Epworth and fatigue score has increased this visit. I have discussed this patient with Dr. Brett Fairy. At this time she suggest trying Xyrem for the patient. Xyrem will be ordered. If Xyrem is not approved the patient may be considered for Adderall. If the patient's symptoms worsen or she develops new symptoms she should let us know. Otherwise she will follow up in 3 months with Dr. Brett Fairy.   Ward Givens, MSN, NP-C 08/27/2014, 2:06 PM [facility] [address] [place], [place] [address] [phone]  Note: This document was prepared with digital dictation and possible smart phrase technology. Any transcriptional errors that result  from this process are unintentional.

## 2014-08-27 NOTE — Progress Notes (Signed)
I agree with the assessment and plan as directed by NP .The patient is known to me .   Maleeah Crossman, MD  

## 2014-08-27 NOTE — Patient Instructions (Signed)
We will start Xyrem. Continue CAP.   Sodium Oxybate oral solution What is this medicine? SODIUM OXYBATE (SOE dee um OX i bate) is used to treat excessive sleepiness and cataplexy in patients with narcolepsy. Cataplexy causes a sudden muscle weakness due to a strong emotional response. This medicine is not available in retail pharmacies. Your doctor will enroll you in a program that will provide the drug to you. This medicine may be used for other purposes; ask your health care provider or pharmacist if you have questions. COMMON BRAND NAME(S): Xyrem What should I tell my health care provider before I take this medicine? They need to know if you have any of these conditions: -depression, psychosis, or other mood disorders -heart disease or high blood pressure -if you frequently drink alcohol containing beverages -if you have a history of drug or alcohol abuse -liver disease -lung disease or difficulty breathing -seizures -succinic semialdehyde dehydrogenase deficiency -thoughts of suicide -an unusual or allergic reaction to sodium oxybate, other medicines, foods, dyes, or preservatives -pregnant or trying to get pregnant -breast-feeding How should I use this medicine? Take this medicine by mouth. Follow the directions on the prescription label. Take this medicine on an empty stomach, at least 30 minutes before or 2 hours after food. Do not take with food. Do not take your medicine more often than directed. A special MedGuide will be given to you by the pharmacist with each prescription and refill. Be sure to read this information carefully each time. Talk to your pediatrician regarding the use of this medicine in children. Special care may be needed. Overdosage: If you think you have taken too much of this medicine contact a poison control center or emergency room at once. NOTE: This medicine is only for you. Do not share this medicine with others. What if I miss a dose? Skip the missed  dose. If it is almost time for your next dose, take only that dose. Allow at least two and one-half hours between each nightly dose. Do not take double or extra doses. What may interact with this medicine? Do not take this medicine with any of the following medications: -alcohol -barbiturates, like phenobarbital -medicines commonly used for anxiety, sedation or insomnia This medicine may also interact with the following medications: -bupropion -divalproex sodium -dronabinol or marijuana -medicines for psychosis or severe mood disturbances -muscle relaxants -other stimulants, although these are commonly used with sodium oxybate -prescription pain medicines, including tramadol -valproate or valproic acid This list may not describe all possible interactions. Give your health care provider a list of all the medicines, herbs, non-prescription drugs, or dietary supplements you use. Also tell them if you smoke, drink alcohol, or use illegal drugs. Some items may interact with your medicine. What should I watch for while using this medicine? The use of this medicine requires careful supervision. Visit your doctor or health care professional for regular checks on your progress. Do not suddenly stop taking this medicine if you have been taking it for a long time. Withdrawal symptoms may occur. Your doctor or health care professional may need to slowly stop your doses. This medicine may affect your concentration or function. Let your doctor or health care professional know if you have increased sleepiness or confusion during the day. This medicine causes sleep very quickly. You should only take this drug at bedtime, while in bed. Do not drive a car, operate heavy machinery or perform any activities that require mental alertness for at least 6 hours after taking  this drug. Use extreme care in any such daily activities until you know how this medicine affects you. Because alcohol may interfere with this  medicine and may cause serious side effects, you must avoid alcohol-containing beverages while on this medicine. Do not take this medicine along with sleep medicines or other drugs with strong sedative effects, serious side effects may occur. This medicine can be dangerous in overdose. If you take more than prescribed or take it by accident, get emergency medical help right away. What side effects may I notice from receiving this medicine? Side effects that you should report to your doctor or health care professional as soon as possible: -allergic reactions like skin rash, itching or hives, swelling of the face, lips, or tongue -breathing problems -confusion -fast, irregular heartbeat -increased blood pressure, particularly if you already have high blood pressure -memory loss -seizures -sleepwalking -tremors or shaking movements -urinary incontinence Side effects that usually do not require medical attention (report to your doctor or health care professional if they continue or are bothersome): -dizziness -drowsiness -headache -increased urination -nausea, vomiting or stomach upset -unusual dreams This list may not describe all possible side effects. Call your doctor for medical advice about side effects. You may report side effects to FDA at 1-800-FDA-1088. Where should I keep my medicine? Keep out of reach of children. This medicine can be abused. Keep your medicine in a safe place to protect it from theft. Do not share this medicine with anyone. Selling or giving away this medicine is dangerous and against the law. Store at room temperature between 15 and 30 degrees C (59 and 86 degrees F). This medicine may cause accidental overdose and death if it is taken by other adults, children, or pets. Flush any unused medicine down the toilet to reduce the chance of harm. Do not use the medicine after the expiration date. NOTE: This sheet is a summary. It may not cover all possible information.  If you have questions about this medicine, talk to your doctor, pharmacist, or health care provider.  2015, Elsevier/Gold Standard. (2013-02-21 15:45:13)

## 2014-08-28 NOTE — Telephone Encounter (Signed)
Rx signed and faxed.

## 2014-09-04 ENCOUNTER — Telehealth: Payer: Self-pay | Admitting: Adult Health

## 2014-09-04 NOTE — Telephone Encounter (Signed)
Pt is calling to check to see if Xyrem has been ordered.  Please call and advise.

## 2014-09-04 NOTE — Telephone Encounter (Signed)
All info was previously sent.  I called the patient back.  She is aware.

## 2014-09-11 ENCOUNTER — Telehealth: Payer: Self-pay | Admitting: Adult Health

## 2014-09-11 NOTE — Telephone Encounter (Signed)
Rosey Batheresa, Pharmacist with [facility] @ [phone], option 3, option 4, requesting doctors approval prior to shipping medication due to inactions with other meds.  Please call and advise.

## 2014-09-11 NOTE — Telephone Encounter (Signed)
Mrs. Tanya Stanley has not been drinking [place] on a regular basis and has clearly indicated that she would not drink alcohol while taking Xyrem. She has been taking Xanax on a when necessary basis and not on a daily basis. She is also aware that this should not be continued when on Xyrem. I would like to resume the Xyrem order as forwarded to [employer]. Sincerely Porfirio Mylararmen Rayhaan Huster M.D.

## 2014-09-11 NOTE — Telephone Encounter (Signed)
I called back.  Spoke with Misty StanleyLisa.  She wants a message forwarded to the provider verifying it okay to dispense Xyrem because the patient has advised them she has anxiety, depression and drinks alcohol.  Please advise.  Thank you.

## 2014-09-12 NOTE — Telephone Encounter (Signed)
I called back.  Spoke with Vernona RiegerLaura.  Relayed Dr Dohmeier's message.  They will notate the chart and proceed with order and call us back if anything further is needed.

## 2014-09-27 ENCOUNTER — Other Ambulatory Visit: Payer: Self-pay

## 2014-09-27 DIAGNOSIS — G4710 Hypersomnia, unspecified: Secondary | ICD-10-CM

## 2014-09-27 DIAGNOSIS — G4730 Sleep apnea, unspecified: Secondary | ICD-10-CM

## 2014-09-27 MED ORDER — ARMODAFINIL 250 MG PO TABS: 250.0000 mg | ORAL_TABLET | Freq: Every day | ORAL | Status: DC

## 2014-10-02 ENCOUNTER — Telehealth: Payer: Self-pay | Admitting: Neurology

## 2014-10-02 NOTE — Telephone Encounter (Signed)
Enrique SackKendra with [employer]  needs information regarding whether patient was denied for Xyrem so she can report back to [facility]. Please call Enrique SackKendra back at [phone] ext. [phone].

## 2014-10-02 NOTE — Telephone Encounter (Signed)
I contacted ins, who said they have denied the request for coverage on Xyrem because it is not being used for an FDA approved diagnosis (documented diagnosis of Narcolepsy is required along with sleep studies confirming this diagnosis).  They indicate a denial letter was sent to the patient.  I called Kendra back.  Got no answer.  Left message.

## 2014-10-03 ENCOUNTER — Telehealth: Payer: Self-pay | Admitting: Adult Health

## 2014-10-03 NOTE — Telephone Encounter (Signed)
Enrique SackKendra with [employer] @ [phone], stated patient doesn't have narcolepsy diagnosis and will refer [facility] for help with Rx ZYRAM.

## 2014-10-08 ENCOUNTER — Encounter: Payer: Self-pay | Admitting: Neurology

## 2014-10-08 ENCOUNTER — Ambulatory Visit (INDEPENDENT_AMBULATORY_CARE_PROVIDER_SITE_OTHER): Payer: 59 | Admitting: Neurology

## 2014-10-08 VITALS — BP 118/77 | HR 86 | Ht 65.0 in | Wt 249.5 lb

## 2014-10-08 DIAGNOSIS — G43001 Migraine without aura, not intractable, with status migrainosus: Secondary | ICD-10-CM | POA: Diagnosis not present

## 2014-10-08 NOTE — Progress Notes (Signed)
[facility]    Provider:  Dr Lucia GaskinsAhern Referring Provider: Milus Heightedmon, Noelle, PA-C Primary Care Physician:  REDMON,NOELLE, PA-C  CC:  Migraine  HPI:  Tanya Stanley is a 49 y.o. female here as a follow up for migraine.   Interval update 10/08/2014: She is doing well with the imitrex nasal spray. She was put on xyrem and she is getting some daily bilateral headaches that feel more like pressure, possibly sinus related. Tried a Zecuity patch today and it fell off. Is having migraines once a month. In the last month having more pressure, may be sinuses. Nose in congested. She takes vicks vapor inhaler, ibuprofen and a claritin. Twice in the past week. Also follows with Dr. Vickey Hugerohmeier and Butch PennyMegan [place]ikan.   Initial visit 07/09/2014: Tanya Stanley is a 49 y.o. female here as a referral from Dr. Sherlyn Lickedmon for Migraines. They started at least 15 years ago. Had a migraine even as a child triggered by a smell. She gets them behind both eyes, throbbing. Light is a trigger. Weather is a trigger as well. Fluorescent light is a trigger. Can be severe 10/10. +nausea and vomiting. Can go up to 5 straight days. Needs a dark room. Tried Imitrex, relpax, toradol, tramadol, flexeril. Never tried Botox. She has a bad one every month at most. Her blood pressure goes up because she has so much anxiety when she has a bad migraine. Denies focal symptoms durin the migraine. No vision changes. No hearing changes. She has excessive daytime fatigue and follows with Dr. Vickey Hugerohmeier. She has had a headache for days now and needs some relief. No aura. Has muscular neck pain with the headaches.   Personally reviewed CT of the brain, no ischmic areas, no masses or edema, normal.   Review of Systems: Patient complains of symptoms per HPI as well as the following symptoms:fatigue, unexpected weight gain, apnea, back pain, nervous, anxious . Pertinent negatives per HPI. All others negative.   History   Social History  .  Marital Status: Single    Spouse Name: N/A  . Number of Children: 0  . Years of Education: BA   Occupational History  . 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 Smoking status: Current Every Day Smoker -- 1.00 packs/day for 20 years  . Smokeless tobacco: Never Used  . Alcohol Use: 0.0 oz/week    0 Standard drinks or equivalent per week     Comment: 5 mixed drinks weekly  . Drug Use: No  . Sexual Activity: Not on file   Other Topics Concern  . Not on file   Social History Narrative   Pt lives at home alone. Single. And with her dog.   Caffeine Use: 1-2 cups daily   Right handed.   FT- QC specialist   BS Chemistry    Family History  Problem Relation Age of Onset  . Ovarian cancer Mother     Past Medical History  Diagnosis Date  . EDS (Ehlers-Danlos syndrome)   . Migraine   . Anxiety and depression   . LBP (low back pain)   . Obstructive apnea     PSG 11-06-12, AHI 27.9 , RDI 36.9 , bipap  10/6 cm   . Hypersomnia, persistent     continued Epworth 20 and higher on BIPAP>   . Migraine   . BiPAP (biphasic positive airway pressure) dependence 05/27/2014    Past Surgical History  Procedure Laterality Date  . Toe surgery  1985    ingrown toenail  . Knee surgery  1989  . Vaginal hysterectomy  2005    Current Outpatient Prescriptions  Medication Sig Dispense Refill  . ALPRAZolam (XANAX) 0.5 MG tablet Take 0.5 tablets (0.25 mg total) by mouth 2 (two) times daily as needed for anxiety. Take at start of migraine for anxiety. May worsen daytime fatigue and somnolence, do not take regularly 15 tablet 3  . Armodafinil (NUVIGIL) 250 MG tablet Take 1 tablet (250 mg total) by mouth daily. 90 tablet 1  . buPROPion (WELLBUTRIN XL) 300 MG 24 hr tablet Take 300 mg by mouth daily.    . calcium-vitamin D (OSCAL) 250-125 MG-UNIT per tablet Take 2 tablets by mouth daily.    Marland Kitchen escitalopram (LEXAPRO) 10 MG tablet Take 10 mg by mouth daily.     . ferrous  sulfate 325 (65 FE) MG tablet Take 325 mg by mouth daily with breakfast.    . fluticasone (FLONASE) 50 MCG/ACT nasal spray Place 2 sprays into the nose daily.    . Multiple Vitamins-Minerals (MULTIVITAMIN PO) Take 1 tablet by mouth daily.    . Sodium Oxybate 500 MG/ML SOLN 2.25gm po twice nightly for 1 week then 3gm twice nightly for 1 week then 3.75gm po twice nightly for 1 week then 4.5gm twice nightly thereafter 1 Bottle 5  . SUMAtriptan (IMITREX) 20 MG/ACT nasal spray Place 1 spray (20 mg total) into the nose every 2 (two) hours as needed for migraine or headache. May repeat in 2 hours if headache persists or recurs. 1 Inhaler 6  . valACYclovir (VALTREX) 1000 MG tablet Take 1,000 mg by mouth as needed.    . Vitamin B Complex-C CAPS Take 1 capsule by mouth daily.     Current Facility-Administered Medications  Medication Dose Route Frequency Provider Last Rate Last Dose  . valproate (DEPACON) 1,000 mg in sodium chloride 0.9 % 100 mL IVPB  1,000 mg Intravenous Continuous Anson Fret, MD   Stopped at 07/09/14 1649    Allergies as of 10/08/2014  . (No Known Allergies)    Vitals: BP 118/77 mmHg  Pulse 86  Ht  (1.651 m)  Wt 249 lb 8 oz (113.172 kg)  BMI 41.52 kg/m2 Last Weight:  Wt Readings from Last 1 Encounters:  10/08/14 249 lb 8 oz (113.172 kg)   Last Height:   Ht Readings from Last 1 Encounters:  10/08/14  (1.651 m)   Physical exam: Exam: Gen: NAD, conversant, well nourised, obese, well groomed                     CV: RRR, no MRG. No Carotid Bruits. No peripheral edema, warm, nontender Eyes: Conjunctivae clear without exudates or hemorrhage  Neuro: Detailed Neurologic Exam  Speech:    Speech is normal; fluent and spontaneous with normal comprehension.  Cognition:    The patient is oriented to person, place, and time;     recent and remote memory intact;     language fluent;     normal attention, concentration,     fund of knowledge Cranial Nerves:     The pupils are equal, round, and reactive to light. The fundi are normal and spontaneous venous pulsations are present. Visual fields are full to finger confrontation. Extraocular movements are intact. Trigeminal sensation is intact and the muscles of mastication are normal. The face is symmetric. The palate elevates in the midline. Hearing intact. Voice is normal. Shoulder shrug is normal. The  tongue has normal motion without fasciculations.     Assessment/Plan: 49 year old female with monthly migraines without aura, not intractable. She does not have frequent migraines, daily preventative not needed. But needs to be able to abort the headache. Discussed the following:    - continue imitrex Nasal spray(orals not working, possibly due to non absorption, she had significant nausea and vomiting). Do not use in the same day as Zecuity Zecuity patch as directed - Xanax 0.25mg  SPARINGLY during migraines when anxiety is making it worse. Discussed thoroughly with patient, she is being treated by Dr. Vickey Huger for excessive daytime fatigue and this medication will worsen her condition. Only as needed for SEVERE anxiety caused by a migraine. Patient expressed understanding.  - follow up as needed, patient doing very well today  To prevent or relieve headaches, try the following:  Cool Compress. Lie down and place a cool compress on your head.   Avoid headache triggers. If certain foods or odors seem to have triggered your migraines in the past, avoid them. A headache diary might help you identify triggers.   Include physical activity in your daily routine. Try a daily walk or other moderate aerobic exercise.   Manage stress. Find healthy ways to cope with the stressors, such as delegating tasks on your to-do list.   Practice relaxation techniques. Try deep breathing, yoga, massage and visualization.   Eat regularly. Eating regularly scheduled meals and maintaining a healthy diet might help  prevent headaches. Also, drink plenty of fluids.   Follow a regular sleep schedule. Sleep deprivation might contribute to headaches  Consider biofeedback. With this mind-body technique, you learn to control certain bodily functions - such as muscle tension, heart rate and blood pressure - to prevent headaches or reduce headache pain.   Proceed to emergency room if you experience new or worsening symptoms or symptoms do not resolve, if you have new neurologic symptoms or if headache is severe, or for any concerning symptom.   Naomie Dean, MD  [facility] [address] [place], [place] [address]  Phone [phone] Fax [phone]  A total of 20  minutes was spent face-to-face with this patient. Over half this time was spent on counseling patient on the migraine diagnosis and different diagnostic and therapeutic options available.

## 2014-10-08 NOTE — Patient Instructions (Signed)
As far as your medications are concerned, I would like to suggest:  Imitrex nasal spray. Spray once in nostril at the onset of your headache. If it does not improve the symptoms please take one additional spray. Do not take more then twice in 24hrs. Do not take use more then 2 to 3 days in a week. Do not use in the same day as Zecuity Zecuity patch as directed Xanax 0.25mg  for severe headache with anxiety. Do not take unless needed for headache and pain as it can worsen daytime somnolence.

## 2014-10-09 NOTE — Progress Notes (Signed)
I agree with the assessment and plan as directed -The patient is known to me .   Illias Pantano, MD  

## 2014-11-22 ENCOUNTER — Telehealth: Payer: Self-pay | Admitting: Neurology

## 2014-11-22 ENCOUNTER — Encounter: Payer: Self-pay | Admitting: *Deleted

## 2014-11-22 NOTE — Telephone Encounter (Signed)
Patient is calling because she has had a migraine for eleven days and would like to come in today for an infusion. Please call. Thank you.

## 2014-11-22 NOTE — Telephone Encounter (Signed)
Talked with pt and told her she can come in for a migraine cocktail. Pt stated she was on her way.

## 2014-12-09 ENCOUNTER — Ambulatory Visit (INDEPENDENT_AMBULATORY_CARE_PROVIDER_SITE_OTHER): Payer: 59 | Admitting: Neurology

## 2014-12-09 ENCOUNTER — Encounter: Payer: Self-pay | Admitting: Neurology

## 2014-12-09 VITALS — BP 118/78 | HR 76 | Resp 20

## 2014-12-09 DIAGNOSIS — G4733 Obstructive sleep apnea (adult) (pediatric): Secondary | ICD-10-CM | POA: Diagnosis not present

## 2014-12-09 DIAGNOSIS — G4710 Hypersomnia, unspecified: Secondary | ICD-10-CM | POA: Diagnosis not present

## 2014-12-09 DIAGNOSIS — G4730 Sleep apnea, unspecified: Secondary | ICD-10-CM | POA: Diagnosis not present

## 2014-12-09 DIAGNOSIS — Z9989 Dependence on other enabling machines and devices: Secondary | ICD-10-CM

## 2014-12-09 MED ORDER — SODIUM OXYBATE 500 MG/ML PO SOLN: 4500.0000 mg | Freq: Two times a day (BID) | ORAL | Status: DC

## 2014-12-09 NOTE — Progress Notes (Signed)
[facility]    Provider:  Dr Jaynee Eagles Referring Provider: Lennie Odor, PA-C Primary Care Physician:  REDMON,NOELLE, PA-C  CC:  Migraine  HPI:  Tanya Stanley is a 49 y.o. female here as a follow up for migraine.   Initial visit 07/09/2014: Tanya Stanley is a 49 y.o. female here as a referral from Dr. Barrie Folk for Migraines. They started at least 15 years ago. Had a migraine even as a child triggered by a smell. She gets them behind both eyes, throbbing. Light is a trigger. Weather is a trigger as well. Fluorescent light is a trigger. Can be severe 10/10. +nausea and vomiting. Can go up to 5 straight days. Needs a dark room. Tried Imitrex, relpax, toradol, tramadol, flexeril. Never tried Botox. She has a bad one every month at most. Her blood pressure goes up because she has so much anxiety when she has a bad migraine. Denies focal symptoms durin the migraine. No vision changes. No hearing changes. She has excessive daytime fatigue and was referred to Dr. Brett Fairy. She has had a headache for days now and needs some relief. No aura. Has muscular neck pain with the headaches. Personally reviewed CT of the brain, no ischmic areas, no masses or edema, normal.  Interval history 12-09-14 the patient is here usually pleasant and cooperative,  She was originally referred to me by Tanya Stanley a best amount in the year 2014 and underwent a sleep study for excessive daytime sleepiness. The patient at the time endorsed the Epworth sleepiness score at 20 points but also the Beck's depression inventory at 13 points. At the time of the study her BMI was 37 and her neck circumference 16 inches she was on Wellbutrin, Xanax, Lexapro, Imitrex etc. etc. Patient had an AHI of 27.9 RDI of 36.9. Supine AHI was not elevated, REM AHI was not elevated. Lowest oxygen saturation was 88% but only 1.4 minutes of desats. Titrated to 5 cm water. Shortly afterwards she still was not able to improve her daytime sleepiness  while on CPAP so an MS LT was performed this patient was diagnosed with narcolepsy. She also underwent the HLA blood test. Which was negative. He was diagnosed with  ideopathic hypersomnia, strongly suspected  to be  narcolepsy by MSLT. She was titrated to Xyrem after not responding to other medications as well Xyrem has helped her stay alert in daytime she reduced her Epworth sleepiness score to 7 and her fatigue severity is 44. She has good nocturnal sleep now sound and no longer vivid or threatening dreams. There is she says that she has no cataplexy.  The [name] is provided through patient assintence program, the nuvigil is additional taken daily.    Review of Systems: Patient complains of symptoms per HPI as well as the following symptoms:fatigue, unexpected weight gain, apnea, back pain, nervous, anxious . Pertinent negatives per HPI. All others negative. Epworth 7 , FSS 44 .   History   Social History  . Marital Status: Single    Spouse Name: N/A  . Number of Children: 0  . Years of Education: BA   Occupational History  . 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 Smoking status: Current Every Day Smoker -- 0.75 packs/day for 20 years    Types: Cigarettes  . Smokeless tobacco: Never Used  . Alcohol Use: No  . Drug Use: No  . Sexual Activity: Not on file   Other Topics Concern  . Not on file  Social History Narrative   Pt lives at home alone. Single. And with her dog.   Caffeine Use: 1-2 cups daily   Right handed.   FT- QC specialist   BS Chemistry    Family History  Problem Relation Age of Onset  . Ovarian cancer Mother     Past Medical History  Diagnosis Date  . EDS (Ehlers-Danlos syndrome)   . Migraine   . Anxiety and depression   . LBP (low back pain)   . Obstructive apnea     PSG 11-06-12, AHI 27.9 , RDI 36.9 , bipap  10/6 cm   . Hypersomnia, persistent     continued Epworth 20 and higher on BIPAP>   . Migraine   . BiPAP  (biphasic positive airway pressure) dependence 05/27/2014    Past Surgical History  Procedure Laterality Date  . Toe surgery  1985    ingrown toenail  . Knee surgery  1989  . Vaginal hysterectomy  2005    Current Outpatient Prescriptions  Medication Sig Dispense Refill  . ALPRAZolam (XANAX) 0.5 MG tablet Take 0.5 tablets (0.25 mg total) by mouth 2 (two) times daily as needed for anxiety. Take at start of migraine for anxiety. May worsen daytime fatigue and somnolence, do not take regularly 15 tablet 3  . Armodafinil (NUVIGIL) 250 MG tablet Take 1 tablet (250 mg total) by mouth daily. 90 tablet 1  . buPROPion (WELLBUTRIN XL) 300 MG 24 hr tablet Take 300 mg by mouth daily.    . calcium-vitamin D (OSCAL) 250-125 MG-UNIT per tablet Take 2 tablets by mouth daily.    Marland Kitchen escitalopram (LEXAPRO) 10 MG tablet Take 10 mg by mouth daily.     . ferrous sulfate 325 (65 FE) MG tablet Take 325 mg by mouth daily with breakfast.    . fluticasone (FLONASE) 50 MCG/ACT nasal spray Place 2 sprays into the nose daily.    . Multiple Vitamins-Minerals (MULTIVITAMIN PO) Take 1 tablet by mouth daily.    . Sodium Oxybate 500 MG/ML SOLN 2.25gm po twice nightly for 1 week then 3gm twice nightly for 1 week then 3.75gm po twice nightly for 1 week then 4.5gm twice nightly thereafter 1 Bottle 5  . SUMAtriptan (IMITREX) 20 MG/ACT nasal spray Place 1 spray (20 mg total) into the nose every 2 (two) hours as needed for migraine or headache. May repeat in 2 hours if headache persists or recurs. 1 Inhaler 6  . valACYclovir (VALTREX) 1000 MG tablet Take 1,000 mg by mouth as needed.    . Vitamin B Complex-C CAPS Take 1 capsule by mouth daily.     Current Facility-Administered Medications  Medication Dose Route Frequency Provider Last Rate Last Dose  . valproate (DEPACON) 1,000 mg in sodium chloride 0.9 % 100 mL IVPB  1,000 mg Intravenous Continuous Melvenia Beam, MD   Stopped at 07/09/14 1649    Allergies as of 12/09/2014  .  (No Known Allergies)    Vitals: BP 118/78 mmHg  Pulse 76  Resp 20 Last Weight:  Wt Readings from Last 1 Encounters:  10/08/14 249 lb 8 oz (113.172 kg)   Last Height:   Ht Readings from Last 1 Encounters:  10/08/14 _0  (1.651 m)   Physical exam: Exam: Gen: NAD, conversant, well nourised, obese, well groomed                     CV: RRR, no MRG. No Carotid Bruits. Normal nasal airflow, Mallampati 3, she  did not undergo tonsillectomy in childhood. Neck circumference 17.5 inches.  No peripheral edema, warm, nontender Eyes: Conjunctivae clear without exudates or hemorrhage  Neuro: Detailed Neurologic Exam Speech: Speech is normal; fluent and spontaneous with normal comprehension.  Cognition:The patient is oriented to person, place, and time;     recent and remote memory intact;     language fluent;     normal attention, concentration,     fund of knowledge Cranial Nerves:The pupils are equal, round, and reactive to light. The fundi are normal and spontaneous venous pulsations are present. Visual fields are full to finger confrontation. Extraocular movements are intact. Trigeminal sensation is intact and the muscles of mastication are normal. The face is symmetric.  The palate elevates in the midline. Hearing intact. Voice is normal. Shoulder shrug is normal. The tongue has normal motion without fasciculations.     Assessment/Plan: 49 year old female with monthly migraines without aura, not intractable.  She does not have frequent migraines, daily preventative not needed. But needs to be able to abort the headache.   She has OSA, on CPAP She has hypersomnia, clincally diagnosed with narcolepsy , now on XYREM much improved. \ The patient was advised in strategies to induce sleep and to reintroduce sleep after sleep interruption, she has also been told about biofeedback for headache prevention, reflected in today's download to the patient is highly compliant CPAP patient she used to  machine 30 out of 30 days was 28 days over 4 hours her average user time is 7 hours 57 minutes the machine is set between 10 and 6 cm water and she has a residual AHI of 1.1. Obstructive sleep apnea is sufficiently treated with excellent compliance hypersomnia is treated on Xyrem was excellent compliance Dr. Jaynee Eagles suggested security patches for migraine. And nasal spray.  Patient expressed understanding.  - follow up as needed, patient doing very well today  Include physical activity in your daily routine. Try a daily walk or other moderate aerobic exercise.   Manage stress. Find healthy ways to cope with the stressors, such as delegating tasks on your to-do list.   Practice relaxation techniques. Try deep breathing, yoga, massage and visualization.   Eat regularly. Eating regularly scheduled meals and maintaining a healthy diet might help prevent headaches. Also, drink plenty of fluids.   Follow a regular sleep schedule. Sleep deprivation might contribute to headaches  Consider biofeedback. With this mind-body technique, you learn to control certain bodily functions - such as muscle tension, heart rate and blood pressure - to prevent headaches or reduce headache pain.    Larey Seat, MD  [facility] [address] [employer] [place] [place], [place] [address]  Phone [phone] Fax [phone]  A total of 20  minutes was spent face-to-face with this patient. Over half this time was spent on counseling patient on the migraine diagnosis and different diagnostic and therapeutic options available.

## 2014-12-09 NOTE — Patient Instructions (Signed)
Sodium Oxybate oral solution What is this medicine? SODIUM OXYBATE (SOE dee um OX i bate) is used to treat excessive sleepiness and cataplexy in patients with narcolepsy. Cataplexy causes a sudden muscle weakness due to a strong emotional response. This medicine is not available in retail pharmacies. Your doctor will enroll you in a program that will provide the drug to you. This medicine may be used for other purposes; ask your health care provider or pharmacist if you have questions. COMMON BRAND NAME(S): Xyrem What should I tell my health care provider before I take this medicine? They need to know if you have any of these conditions: -depression, psychosis, or other mood disorders -heart disease or high blood pressure -if you frequently drink alcohol containing beverages -if you have a history of drug or alcohol abuse -liver disease -lung disease or difficulty breathing -seizures -succinic semialdehyde dehydrogenase deficiency -thoughts of suicide -an unusual or allergic reaction to sodium oxybate, other medicines, foods, dyes, or preservatives -pregnant or trying to get pregnant -breast-feeding How should I use this medicine? Take this medicine by mouth. Follow the directions on the prescription label. Take this medicine on an empty stomach, at least 30 minutes before or 2 hours after food. Do not take with food. Do not take your medicine more often than directed. A special MedGuide will be given to you by the pharmacist with each prescription and refill. Be sure to read this information carefully each time. Talk to your pediatrician regarding the use of this medicine in children. Special care may be needed. Overdosage: If you think you have taken too much of this medicine contact a poison control center or emergency room at once. NOTE: This medicine is only for you. Do not share this medicine with others. What if I miss a dose? Skip the missed dose. If it is almost time for your next  dose, take only that dose. Allow at least two and one-half hours between each nightly dose. Do not take double or extra doses. What may interact with this medicine? Do not take this medicine with any of the following medications: -alcohol -barbiturates, like phenobarbital -medicines commonly used for anxiety, sedation or insomnia This medicine may also interact with the following medications: -bupropion -divalproex sodium -dronabinol or marijuana -medicines for psychosis or severe mood disturbances -muscle relaxants -other stimulants, although these are commonly used with sodium oxybate -prescription pain medicines, including tramadol -valproate or valproic acid This list may not describe all possible interactions. Give your health care provider a list of all the medicines, herbs, non-prescription drugs, or dietary supplements you use. Also tell them if you smoke, drink alcohol, or use illegal drugs. Some items may interact with your medicine. What should I watch for while using this medicine? The use of this medicine requires careful supervision. Visit your doctor or health care professional for regular checks on your progress. Do not suddenly stop taking this medicine if you have been taking it for a long time. Withdrawal symptoms may occur. Your doctor or health care professional may need to slowly stop your doses. This medicine may affect your concentration or function. Let your doctor or health care professional know if you have increased sleepiness or confusion during the day. This medicine causes sleep very quickly. You should only take this drug at bedtime, while in bed. Do not drive a car, operate heavy machinery or perform any activities that require mental alertness for at least 6 hours after taking this drug. Use extreme care in any such   daily activities until you know how this medicine affects you. Because alcohol may interfere with this medicine and may cause serious side effects,  you must avoid alcohol-containing beverages while on this medicine. Do not take this medicine along with sleep medicines or other drugs with strong sedative effects, serious side effects may occur. This medicine can be dangerous in overdose. If you take more than prescribed or take it by accident, get emergency medical help right away. What side effects may I notice from receiving this medicine? Side effects that you should report to your doctor or health care professional as soon as possible: -allergic reactions like skin rash, itching or hives, swelling of the face, lips, or tongue -breathing problems -confusion -fast, irregular heartbeat -increased blood pressure, particularly if you already have high blood pressure -memory loss -seizures -sleepwalking -tremors or shaking movements -urinary incontinence Side effects that usually do not require medical attention (report to your doctor or health care professional if they continue or are bothersome): -dizziness -drowsiness -headache -increased urination -nausea, vomiting or stomach upset -unusual dreams This list may not describe all possible side effects. Call your doctor for medical advice about side effects. You may report side effects to FDA at 1-800-FDA-1088. Where should I keep my medicine? Keep out of reach of children. This medicine can be abused. Keep your medicine in a safe place to protect it from theft. Do not share this medicine with anyone. Selling or giving away this medicine is dangerous and against the law. Store at room temperature between 15 and 30 degrees C (59 and 86 degrees F). This medicine may cause accidental overdose and death if it is taken by other adults, children, or pets. Flush any unused medicine down the toilet to reduce the chance of harm. Do not use the medicine after the expiration date. NOTE: This sheet is a summary. It may not cover all possible information. If you have questions about this medicine,  talk to your doctor, pharmacist, or health care provider.  2015, Elsevier/Gold Standard. (2013-02-21 15:45:13)  

## 2014-12-10 LAB — COMPREHENSIVE METABOLIC PANEL
ALT: 16 IU/L (ref 0–32)
AST: 12 IU/L (ref 0–40)
Albumin/Globulin Ratio: 2.1 (ref 1.1–2.5)
Albumin: 4.4 g/dL (ref 3.5–5.5)
Alkaline Phosphatase: 58 IU/L (ref 39–117)
BUN/Creatinine Ratio: 25 — ABNORMAL HIGH (ref 9–23)
BUN: 15 mg/dL (ref 6–24)
Bilirubin Total: <0.2 mg/dL (ref 0.0–1.2)
CO2: 19 mmol/L (ref 18–29)
Calcium: 9.2 mg/dL (ref 8.7–10.2)
Chloride: 105 mmol/L (ref 97–108)
Creatinine, Ser: 0.6 mg/dL (ref 0.57–1.00)
GFR calc Af Amer: 124 mL/min/{1.73_m2} (ref >59)
GFR calc non Af Amer: 107 mL/min/{1.73_m2} (ref >59)
Globulin, Total: 2.1 g/dL (ref 1.5–4.5)
Glucose: 97 mg/dL (ref 65–99)
Potassium: 4.3 mmol/L (ref 3.5–5.2)
Sodium: 141 mmol/L (ref 134–144)
Total Protein: 6.5 g/dL (ref 6.0–8.5)

## 2014-12-16 ENCOUNTER — Telehealth: Payer: Self-pay

## 2014-12-16 NOTE — Telephone Encounter (Signed)
Called pt to give results of recent labs. No answer, left message asking her to call me back.

## 2014-12-16 NOTE — Progress Notes (Signed)
Quick Note:  LMVM for pt that lab results normal. (bun can be higher if not hyrated). Pt to call back if questions. ______

## 2015-01-23 ENCOUNTER — Ambulatory Visit
Admission: RE | Admit: 2015-01-23 | Discharge: 2015-01-23 | Disposition: A | Discharge status: Home / Self Care | Payer: 59 | Source: Ambulatory Visit | Attending: Physician Assistant | Admitting: Physician Assistant

## 2015-01-23 ENCOUNTER — Other Ambulatory Visit: Payer: Self-pay | Admitting: Physician Assistant

## 2015-01-23 DIAGNOSIS — R52 Pain, unspecified: Secondary | ICD-10-CM

## 2015-02-13 ENCOUNTER — Telehealth: Payer: Self-pay | Admitting: Neurology

## 2015-02-13 NOTE — Telephone Encounter (Addendum)
Patient called to follow up on this request. Please call and advise [phone]).

## 2015-02-13 NOTE — Telephone Encounter (Signed)
Kristine with [facility]. She states a request for refill for Zyrem was faxed on 6/21/and/6/27 and have not had a response as of yet. I gave her the fax number of [phone]. Please call and confirm.

## 2015-02-13 NOTE — Telephone Encounter (Signed)
I called the patient back.  Got no answer.  Left message relaying info provided by [facility] stating Rx was too soon to refill until 07/12.

## 2015-02-13 NOTE — Telephone Encounter (Signed)
It appears 6 refills were sent to [facility] on 12/09/2014.  I called them back.  Spoke with Vikki PortsValerie.  She said the patient does have refills on file, however, it is too soon to refill at this time.  She is due for next refill on 07/12, and Rx can be processed at that time.

## 2015-03-24 ENCOUNTER — Ambulatory Visit: Payer: 59 | Admitting: Adult Health

## 2015-04-28 ENCOUNTER — Ambulatory Visit (INDEPENDENT_AMBULATORY_CARE_PROVIDER_SITE_OTHER): Payer: 59 | Admitting: Adult Health

## 2015-04-28 ENCOUNTER — Encounter: Payer: Self-pay | Admitting: Adult Health

## 2015-04-28 VITALS — BP 120/81 | HR 72 | Ht 65.0 in | Wt 246.5 lb

## 2015-04-28 DIAGNOSIS — G4733 Obstructive sleep apnea (adult) (pediatric): Secondary | ICD-10-CM | POA: Diagnosis not present

## 2015-04-28 DIAGNOSIS — Z9989 Dependence on other enabling machines and devices: Principal | ICD-10-CM

## 2015-04-28 DIAGNOSIS — G4710 Hypersomnia, unspecified: Secondary | ICD-10-CM | POA: Diagnosis not present

## 2015-04-28 NOTE — Patient Instructions (Signed)
Continue using CPAP nightly Continue Xyrem and Nuvigil. Have PCP fax over lab results once completed.

## 2015-04-28 NOTE — Progress Notes (Signed)
PATIENT: Tanya Stanley DOB: [birth year]  REASON FOR VISIT: follow up- OSA on CPAP HISTORY FROM: patient  HISTORY OF PRESENT ILLNESS: Mrs. Tanya Stanley is a 49 year old female with a history of obstructive sleep apnea on CPAP and narcolepsy. She returns today for an evaluation. Her CPAP download indicates that she used her machine 26 out of 30 days for compliance of 87%. She uses her machine greater than 4 hours 23 out of 30 days for compliance of 77%. On average she uses her machine 7 hours and 25 minutes. Her residual AHI is 0.8 on an inspiratory pressure of 10 cm of water and expiratory pressure of 6 cm of water. She reports that her Epworth sleepiness score is 10 and fatigue severity score is 48. She continues to use Xyrem and Nuvigil. She reports this is working very well for her. She states occasionally she'll have bad days where she feels severely fatigued. She denies any new medical issues. She returns today for an evaluation.  HISTORY 12/09/14: Tanya Stanley is a 49 y.o. female here as a referral from Dr. Barrie Folk for Migraines. They started at least 15 years ago. Had a migraine even as a child triggered by a smell. She gets them behind both eyes, throbbing. Light is a trigger. Weather is a trigger as well. Fluorescent light is a trigger. Can be severe 10/10. +nausea and vomiting. Can go up to 5 straight days. Needs a dark room. Tried Imitrex, relpax, toradol, tramadol, flexeril. Never tried Botox. She has a bad one every month at most. Her blood pressure goes up because she has so much anxiety when she has a bad migraine. Denies focal symptoms durin the migraine. No vision changes. No hearing changes. She has excessive daytime fatigue and was referred to Dr. Brett Fairy. She has had a headache for days now and needs some relief. No aura. Has muscular neck pain with the headaches. Personally reviewed CT of the brain, no ischmic areas, no masses or edema, normal.  Interval history 12-09-14 the patient is here  usually pleasant and cooperative,  She was originally referred to me by [name] a best amount in the year 2014 and underwent a sleep study for excessive daytime sleepiness. The patient at the time endorsed the Epworth sleepiness score at 20 points but also the Beck's depression inventory at 13 points. At the time of the study her BMI was 37 and her neck circumference 16 inches she was on Wellbutrin, Xanax, Lexapro, Imitrex etc. etc. Patient had an AHI of 27.9 RDI of 36.9. Supine AHI was not elevated, REM AHI was not elevated. Lowest oxygen saturation was 88% but only 1.4 minutes of desats. Titrated to 5 cm water. Shortly afterwards she still was not able to improve her daytime sleepiness while on CPAP so an MS LT was performed this patient was diagnosed with narcolepsy. She also underwent the HLA blood test. Which was negative. 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 She was titrated to Xyrem after not responding to other medications as well Xyrem has helped her stay alert in daytime she reduced her Epworth sleepiness score to 7 and her fatigue severity is 44. She has good nocturnal sleep now sound and no longer vivid or threatening dreams. There is she says that she has no cataplexy.  The [name] is provided through patient assintence program, the nuvigil is additional taken daily.    REVIEW OF SYSTEMS: Out of a complete 14 system review of symptoms, the patient  complains only of the following symptoms, and all other reviewed systems are negative.  Fatigue, environmental allergies, back pain, apnea, daytime sleepiness, depression, nervous/anxious  ALLERGIES: No Known Allergies  HOME MEDICATIONS: Outpatient Prescriptions Prior to Visit  Medication Sig Dispense Refill  . ALPRAZolam (XANAX) 0.5 MG tablet Take 0.5 tablets (0.25 mg total) by mouth 2 (two) times daily as needed for anxiety. Take at start of migraine for anxiety. May worsen daytime  fatigue and somnolence, do not take regularly 15 tablet 3  . Armodafinil (NUVIGIL) 250 MG tablet Take 1 tablet (250 mg total) by mouth daily. 90 tablet 1  . buPROPion (WELLBUTRIN XL) 300 MG 24 hr tablet Take 300 mg by mouth daily.    . calcium-vitamin D (OSCAL) 250-125 MG-UNIT per tablet Take 2 tablets by mouth daily.    Marland Kitchen escitalopram (LEXAPRO) 10 MG tablet Take 10 mg by mouth daily.     . ferrous sulfate 325 (65 FE) MG tablet Take 325 mg by mouth daily with breakfast.    . fluticasone (FLONASE) 50 MCG/ACT nasal spray Place 2 sprays into the nose daily.    . Multiple Vitamins-Minerals (MULTIVITAMIN PO) Take 1 tablet by mouth daily.    . Sodium Oxybate 500 MG/ML SOLN Take 9 mLs (4,500 mg total) by mouth 2 (two) times daily. 270 mL 5  . SUMAtriptan (IMITREX) 20 MG/ACT nasal spray Place 1 spray (20 mg total) into the nose every 2 (two) hours as needed for migraine or headache. May repeat in 2 hours if headache persists or recurs. 1 Inhaler 6  . valACYclovir (VALTREX) 1000 MG tablet Take 1,000 mg by mouth as needed.    . Vitamin B Complex-C CAPS Take 1 capsule by mouth daily.     Facility-Administered Medications Prior to Visit  Medication Dose Route Frequency Provider Last Rate Last Dose  . valproate (DEPACON) 1,000 mg in sodium chloride 0.9 % 100 mL IVPB  1,000 mg Intravenous Continuous Melvenia Beam, MD   Stopped at 07/09/14 1649    PAST MEDICAL HISTORY: Past Medical History  Diagnosis Date  . EDS (Ehlers-Danlos syndrome)   . Migraine   . Anxiety and depression   . LBP (low back pain)   . Obstructive apnea     PSG 11-06-12, AHI 27.9 , RDI 36.9 , bipap  10/6 cm   . Hypersomnia, persistent     continued Epworth 20 and higher on BIPAP>   . Migraine   . BiPAP (biphasic positive airway pressure) dependence 05/27/2014    PAST SURGICAL HISTORY: Past Surgical History  Procedure Laterality Date  . Toe surgery  1985    ingrown toenail  . Knee surgery  1989  . Vaginal hysterectomy  2005      FAMILY HISTORY: Family History  Problem Relation Age of Onset  . Ovarian cancer Mother     SOCIAL HISTORY: Social History   Social History  . Marital Status: Single    Spouse Name: N/A  . Number of Children: 0  . Years of Education: BA   Occupational History  . 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 Smoking status: Current Every Day Smoker -- 0.50 packs/day for 20 years    Types: Cigarettes  . Smokeless tobacco: Never Used  . Alcohol Use: No  . Drug Use: No  . Sexual Activity: Not on file   Other Topics Concern  . Not on file   Social History Narrative   Pt lives at  home alone. Single. And with her dog.   Caffeine Use: 1-2 cups daily   Right handed.   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  Generalized: Well developed, in no acute distress  Neck: Circumference 17 inches, Mallampati 4+  Neurological examination  Mentation: Alert oriented to time, place, history taking. Follows all commands speech and language fluent Cranial nerve II-XII: Pupils were equal round reactive to light. Extraocular movements were full, visual field were full on confrontational test. Facial sensation and strength were normal. Uvula tongue midline. Head turning and shoulder shrug  were normal and symmetric. Motor: The motor testing reveals 5 over 5 strength of all 4 extremities. Good symmetric motor tone is noted throughout.  Sensory: Sensory testing is intact to soft touch on all 4 extremities. No evidence of extinction is noted.  Coordination: Cerebellar testing reveals good finger-nose-finger and heel-to-shin bilaterally.  Gait and station: Gait is normal. Tandem gait is normal. Romberg is negative. No drift is seen.  Reflexes: Deep tendon reflexes are symmetric and normal bilaterally.    DIAGNOSTIC DATA (LABS, IMAGING, TESTING) - I reviewed patient records, labs, notes, testing and imaging myself where available.  No results found for: WBC, HGB, HCT, MCV, PLT    Component Value Date/Time   NA 141 12/09/2014 1601   K 4.3 12/09/2014 1601   CL 105 12/09/2014 1601   CO2 19 12/09/2014 1601   GLUCOSE 97 12/09/2014 1601   BUN 15 12/09/2014 1601   CREATININE 0.60 12/09/2014 1601   CALCIUM 9.2 12/09/2014 1601   PROT 6.5 12/09/2014 1601   AST 12 12/09/2014 1601   ALT 16 12/09/2014 1601   ALKPHOS 58 12/09/2014 1601   BILITOT <0.2 12/09/2014 1601   GFRNONAA 107 12/09/2014 1601   GFRAA 124 12/09/2014 1601      ASSESSMENT AND PLAN 49 y.o. year old female  has a past medical history of EDS (Ehlers-Danlos syndrome); Migraine; Anxiety and depression; LBP (low back pain); Obstructive apnea; Hypersomnia, persistent; Migraine; and BiPAP (biphasic positive airway pressure) dependence (05/27/2014). here with:  1. Obstructive sleep apnea on CPAP 2. Narcolepsy  Overall the patient is doing well. Her compliance with CPAP is excellent. She was encouraged to continue using CPAP nightly. She will continue on Xyrem and Nuvigil. She has a physical scheduled in the follow weeks with her PCP- she will have bloodwork completed there. I have provided her with our fax number she will have her lab results faxed to our office once completed. she will follow-up in 3-4 months with Dr. Mechele Claude, MSN, NP-C 04/28/2015, 1:38 PM [facility] [address], [place], [place] [address] [phone]

## 2015-04-28 NOTE — Progress Notes (Signed)
I have reviewed and agreed above plan. 

## 2015-04-29 ENCOUNTER — Ambulatory Visit: Payer: 59 | Admitting: Adult Health

## 2015-05-19 ENCOUNTER — Other Ambulatory Visit: Payer: Self-pay

## 2015-05-19 DIAGNOSIS — G4710 Hypersomnia, unspecified: Secondary | ICD-10-CM

## 2015-05-19 DIAGNOSIS — G4730 Sleep apnea, unspecified: Secondary | ICD-10-CM

## 2015-05-19 MED ORDER — ARMODAFINIL 250 MG PO TABS: 250.0000 mg | ORAL_TABLET | Freq: Every day | ORAL | Status: DC

## 2015-05-19 NOTE — Telephone Encounter (Signed)
Rx signed and faxed.

## 2015-05-27 ENCOUNTER — Other Ambulatory Visit: Payer: Self-pay

## 2015-05-27 MED ORDER — ALPRAZOLAM 0.5 MG PO TABS: 0.2500 mg | ORAL_TABLET | Freq: Two times a day (BID) | ORAL | Status: AC | PRN

## 2015-05-27 NOTE — Telephone Encounter (Signed)
Originally prescribed by Dr Lucia GaskinsAhern in Dec 2015 to treat migraine attack/anxiety.  Patient would like a refill.  Has an appt scheduled in Jan.

## 2015-05-27 NOTE — Telephone Encounter (Signed)
Rx signed and faxed.

## 2015-07-09 ENCOUNTER — Other Ambulatory Visit: Payer: Self-pay

## 2015-07-09 DIAGNOSIS — G4710 Hypersomnia, unspecified: Secondary | ICD-10-CM

## 2015-07-09 DIAGNOSIS — Z9989 Dependence on other enabling machines and devices: Secondary | ICD-10-CM

## 2015-07-09 DIAGNOSIS — G4733 Obstructive sleep apnea (adult) (pediatric): Secondary | ICD-10-CM

## 2015-07-09 DIAGNOSIS — G4730 Sleep apnea, unspecified: Principal | ICD-10-CM

## 2015-07-09 MED ORDER — SODIUM OXYBATE 500 MG/ML PO SOLN: 4500.0000 mg | Freq: Two times a day (BID) | ORAL | Status: DC

## 2015-07-09 NOTE — Telephone Encounter (Signed)
Rx signed and faxed.

## 2015-08-05 IMAGING — CR DG HAND COMPLETE 3+V*R*
3 series · 3 of 3 positions shown · non-contrast
Comparison: None.

CLINICAL DATA: Chronic joint pain.  No trauma.

EXAM:
RIGHT HAND - COMPLETE 3+ VIEW

[x hand pa right]
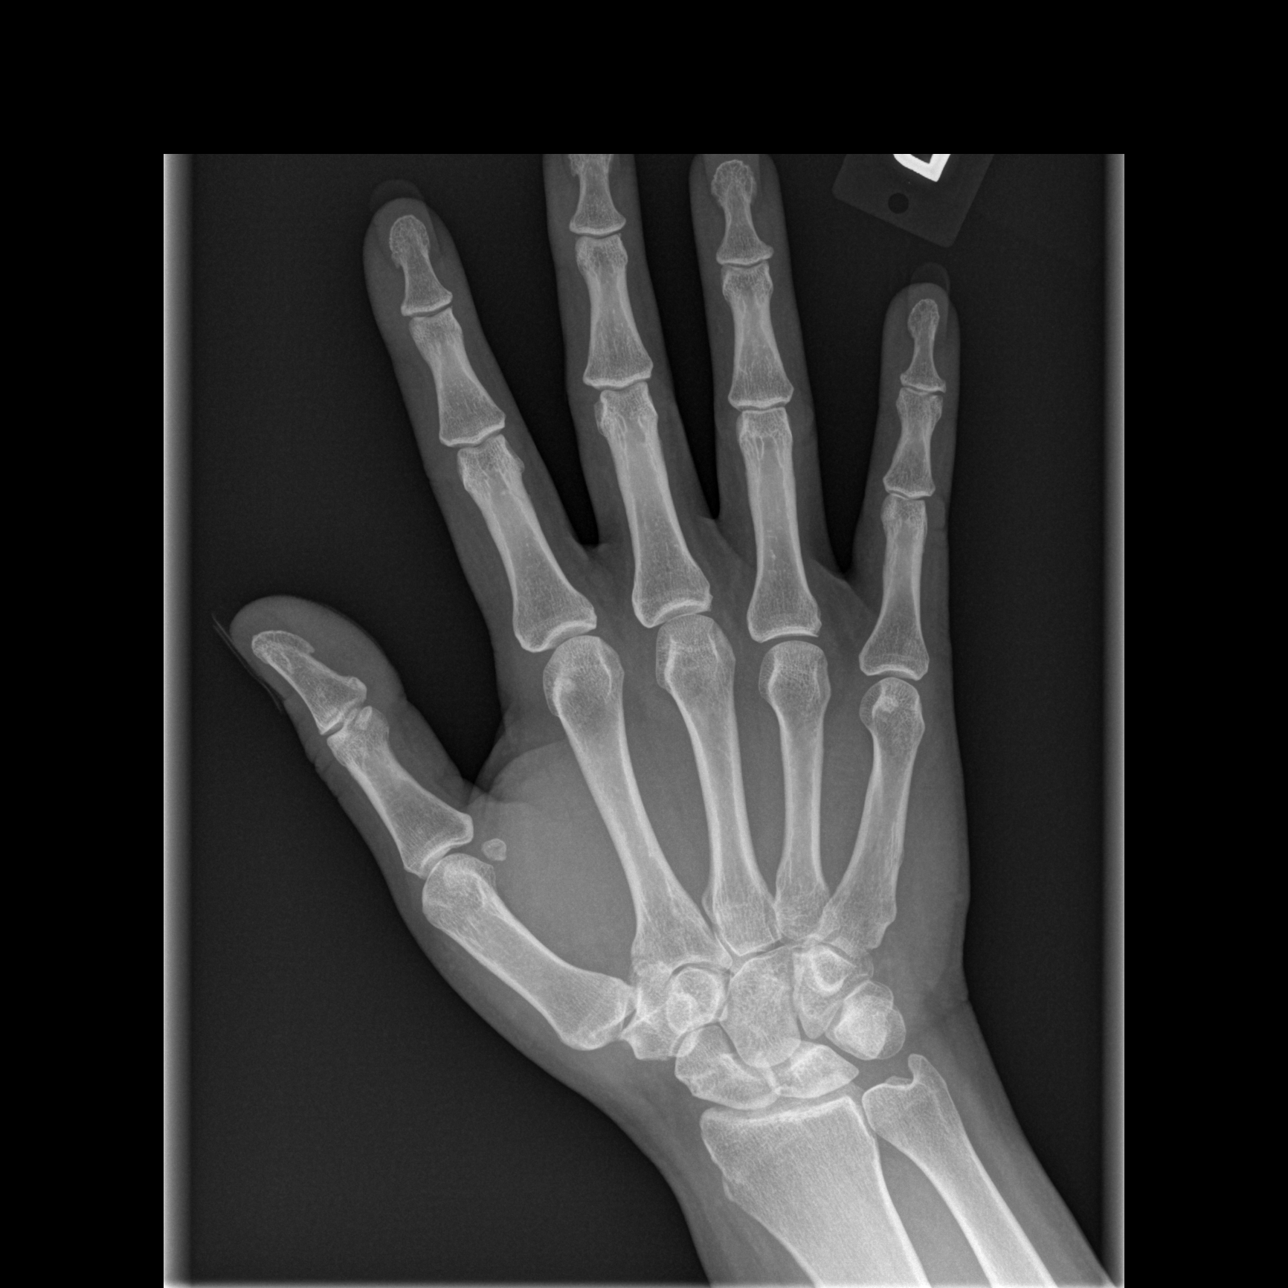

[x hand oblique right]
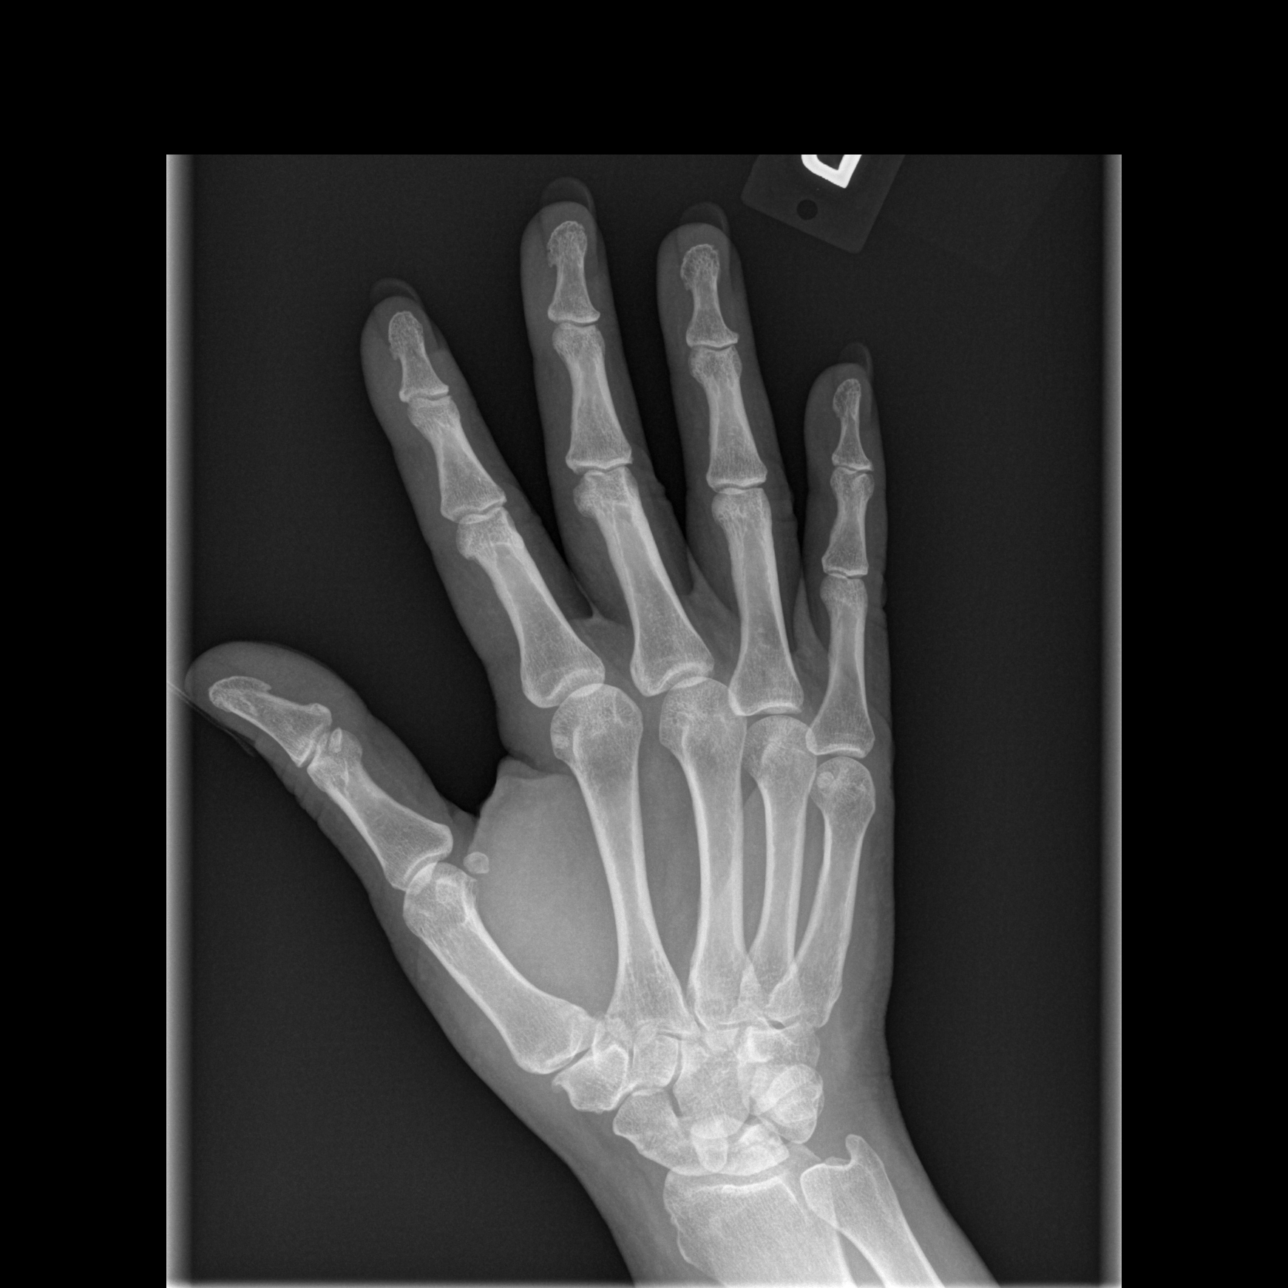

[x hand lat right]
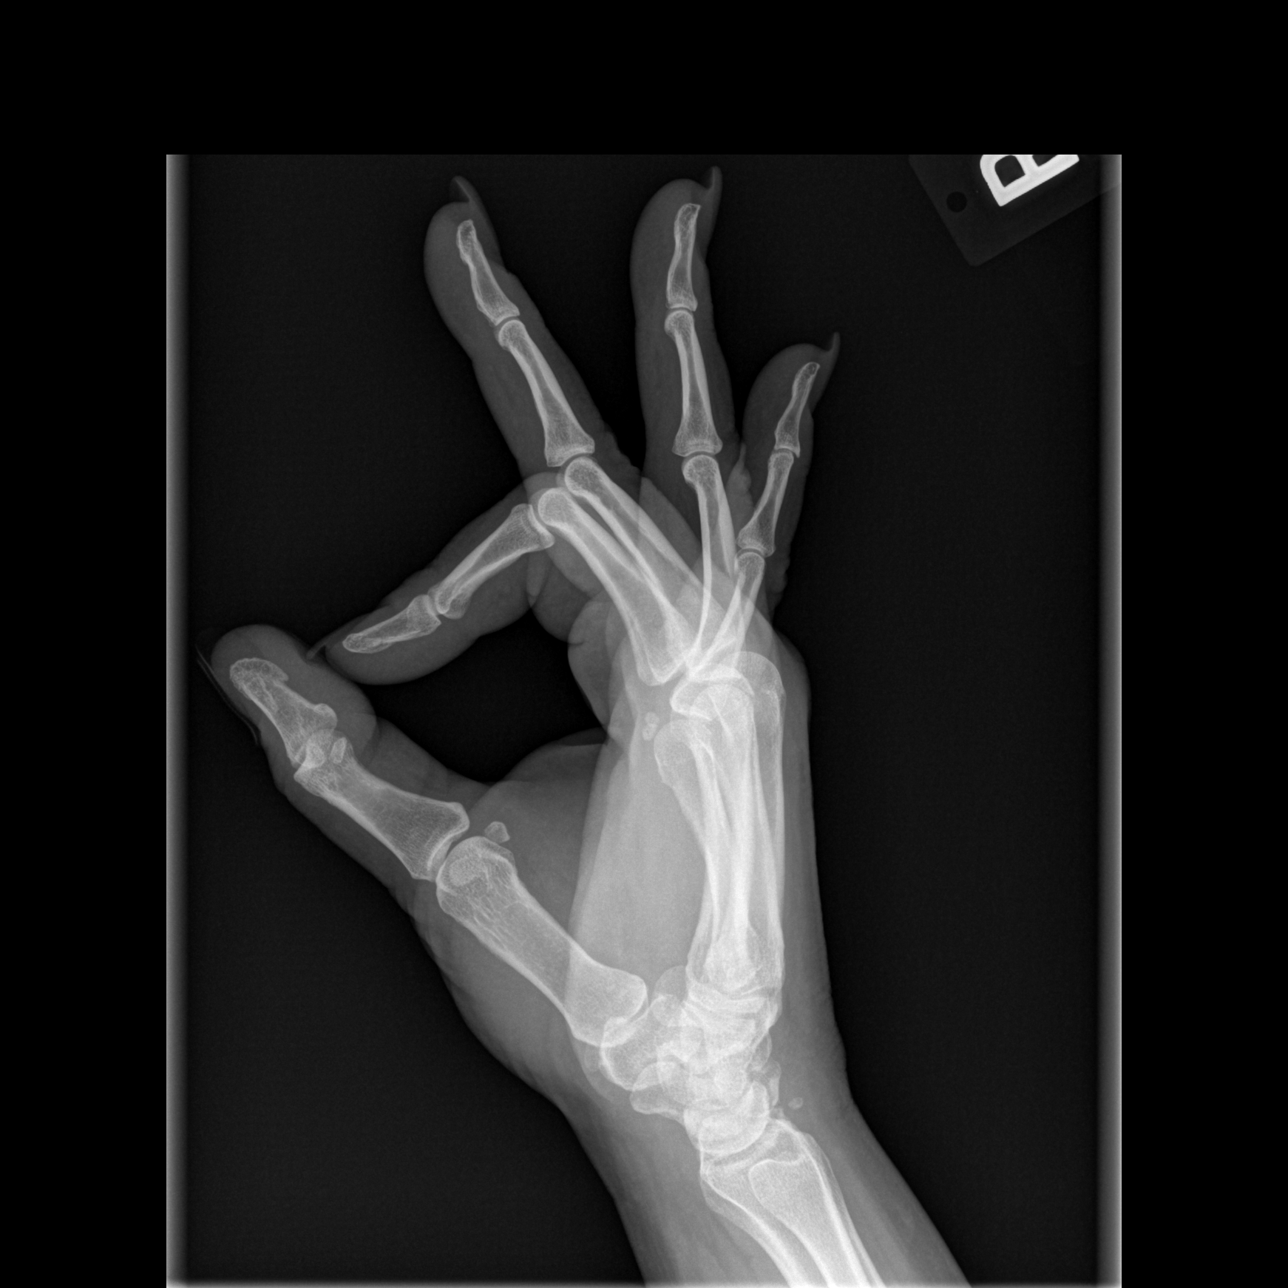

[3 of 3 positions shown; findings below may reference images not displayed]

FINDINGS: Examination demonstrates mild degenerate change over the radiocarpal
joint and carpal bones as well as first carpometacarpal joint. No
evidence of fracture dislocation. No abnormality in bone density or
alignment. No erosions.
IMPRESSION: No acute findings.

Mild degenerate change over the wrist.

## 2015-08-05 IMAGING — CR DG HAND COMPLETE 3+V*L*
3 series · 3 of 3 positions shown · non-contrast
Comparison: None.

CLINICAL DATA: Chronic joint pain for 3 months.  No trauma.

EXAM:
LEFT HAND - COMPLETE 3+ VIEW

[x hand pa left]
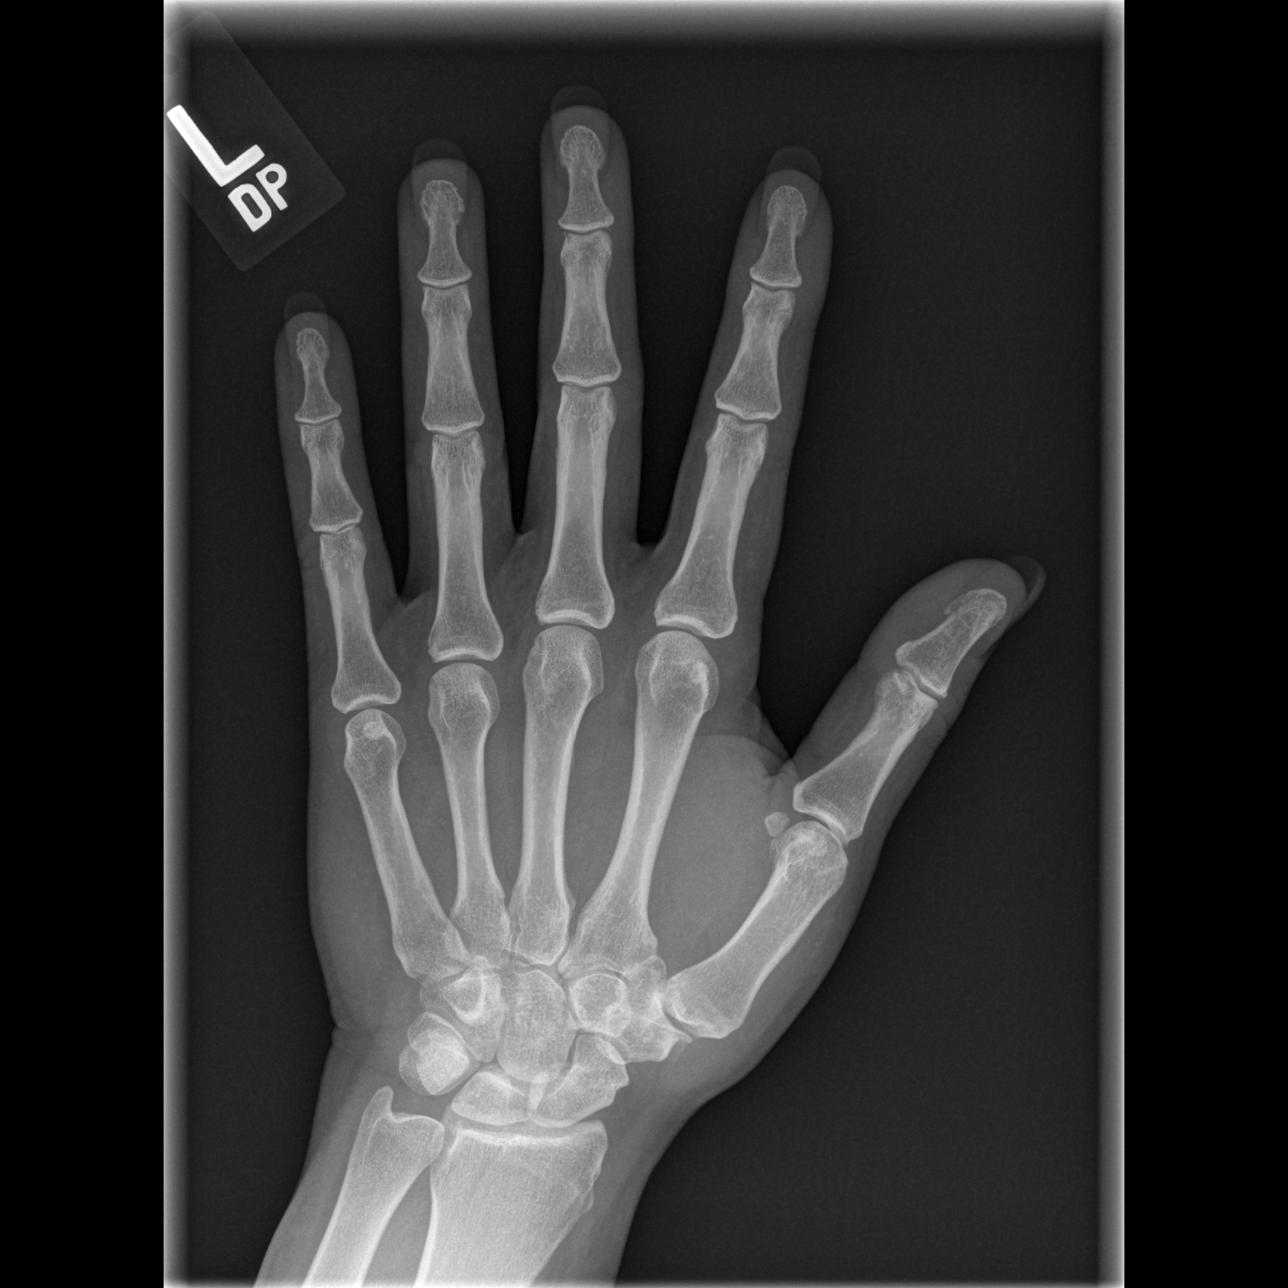

[x hand oblique left]
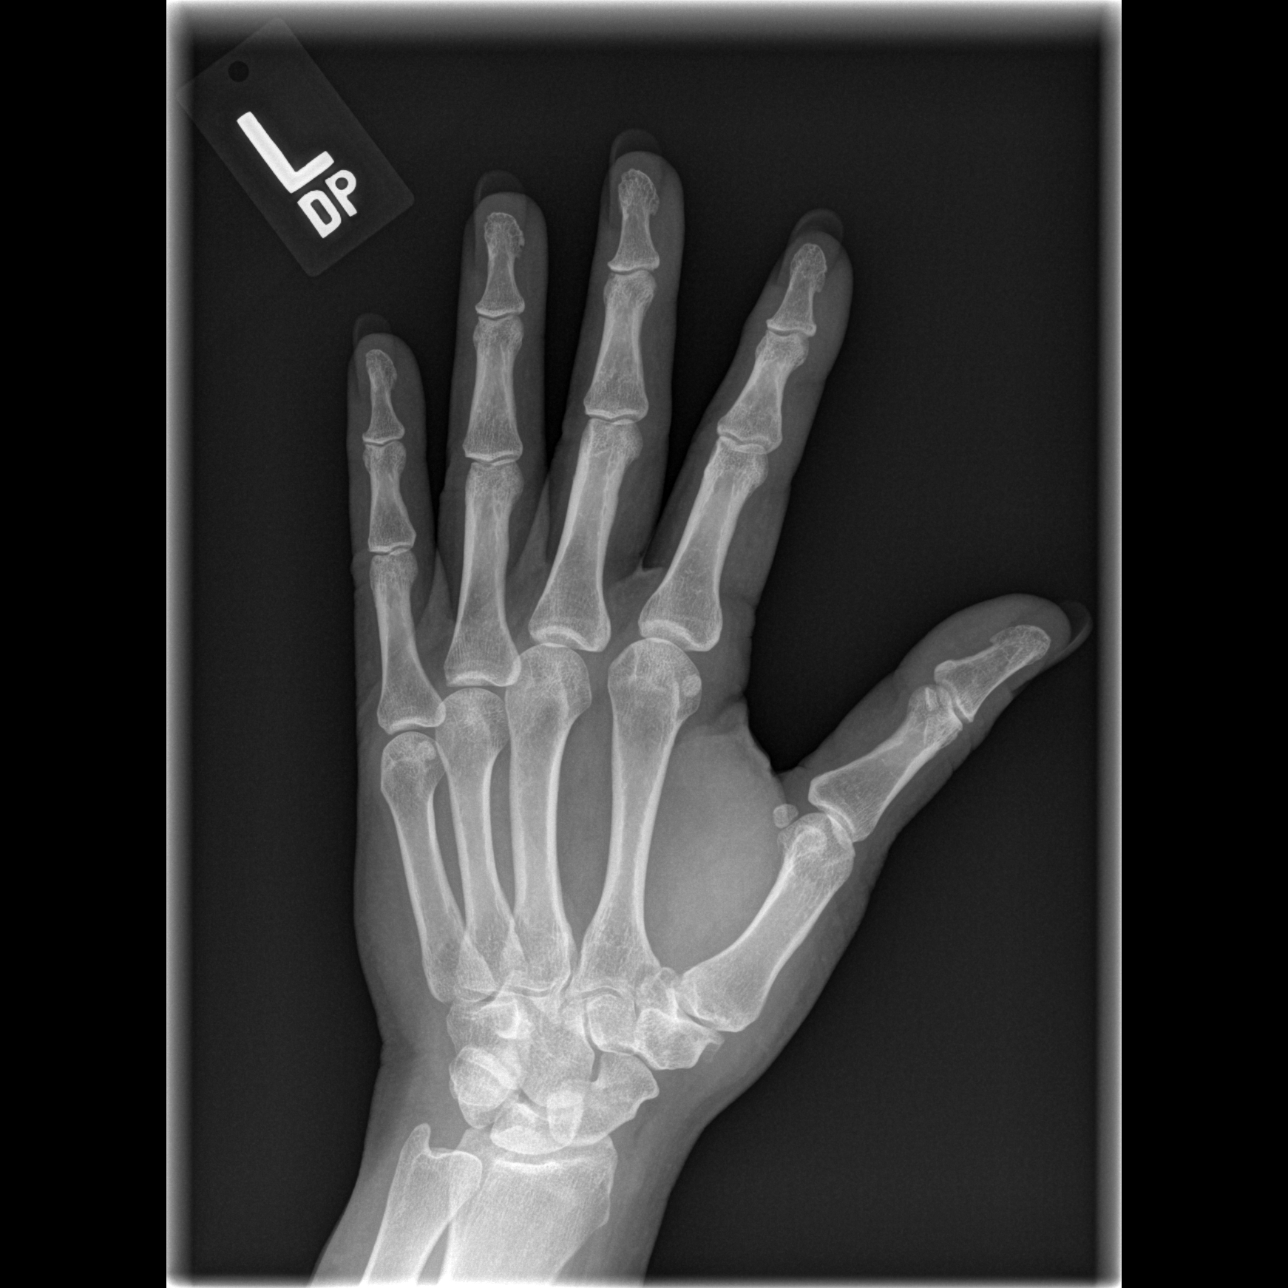

[x hand lat left]
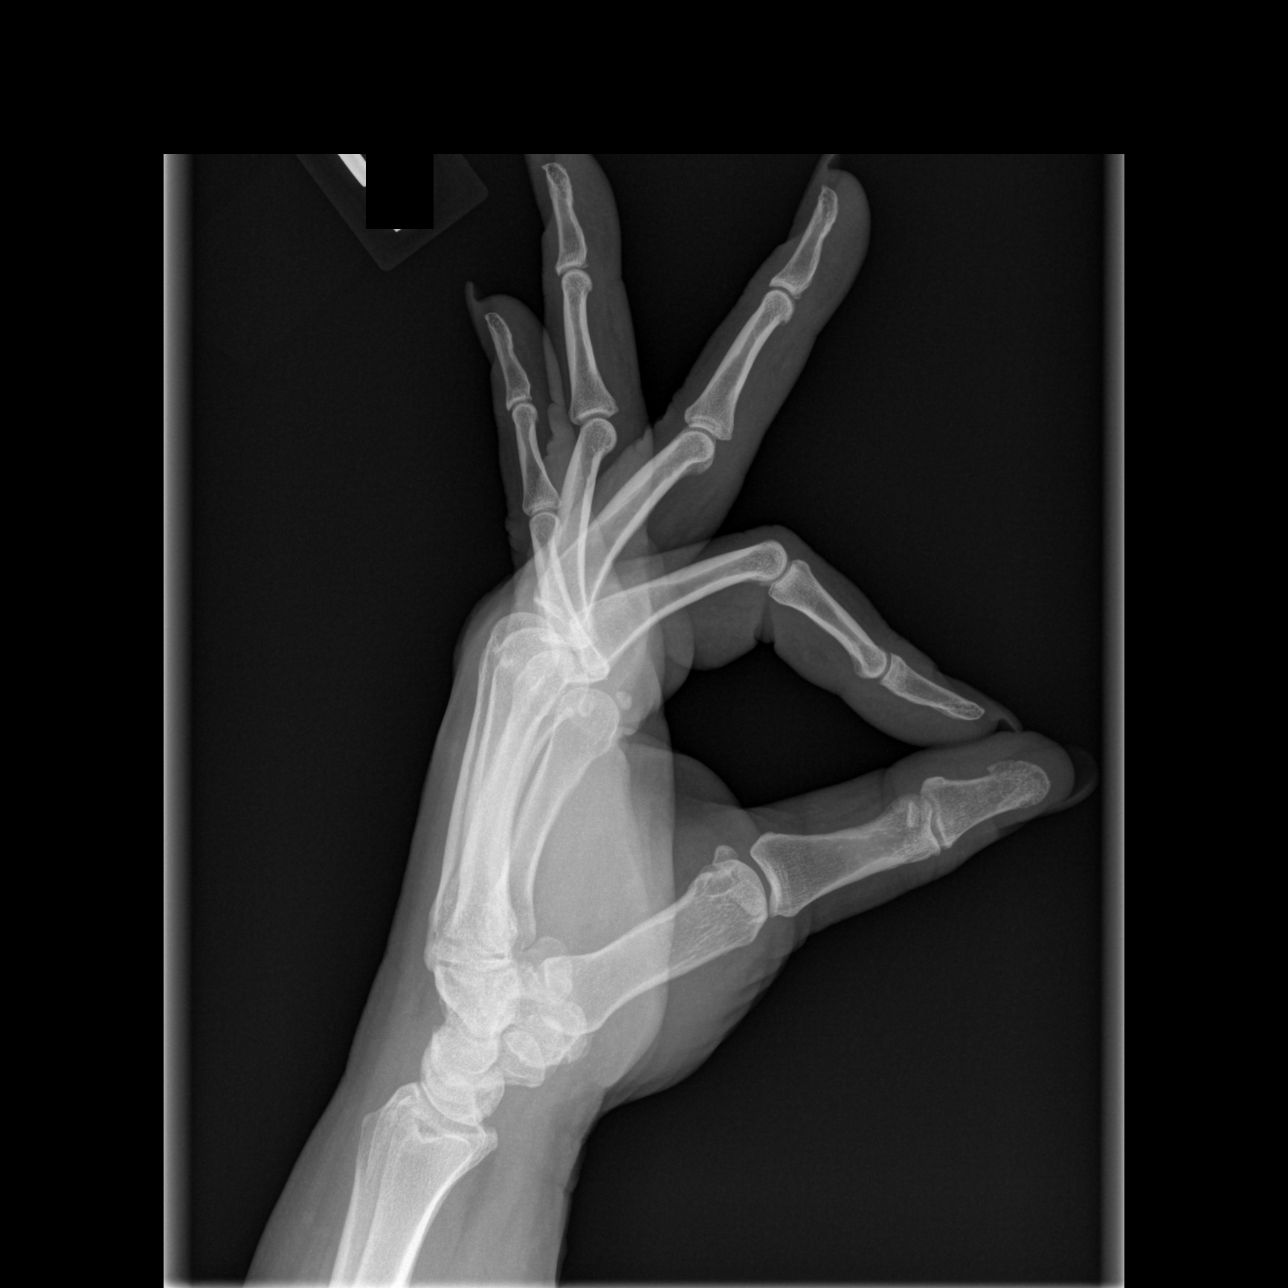

[3 of 3 positions shown; findings below may reference images not displayed]

FINDINGS: No acute fracture or dislocation is identified. Bone mineralization
appears normal. Joint space widths are preserved. There is at most
minimal marginal spurring at several DIP joints. No osseous erosions
are identified. No soft tissue abnormality is seen.
IMPRESSION: No acute osseous abnormality or significant arthropathic changes
identified.

## 2015-08-28 ENCOUNTER — Ambulatory Visit (INDEPENDENT_AMBULATORY_CARE_PROVIDER_SITE_OTHER): Payer: No Typology Code available for payment source | Admitting: Neurology

## 2015-08-28 ENCOUNTER — Encounter: Payer: Self-pay | Admitting: Neurology

## 2015-08-28 VITALS — BP 152/84 | HR 78 | Resp 20 | Ht 66.0 in | Wt 231.0 lb

## 2015-08-28 DIAGNOSIS — G4733 Obstructive sleep apnea (adult) (pediatric): Secondary | ICD-10-CM | POA: Diagnosis not present

## 2015-08-28 DIAGNOSIS — G4710 Hypersomnia, unspecified: Secondary | ICD-10-CM | POA: Diagnosis not present

## 2015-08-28 DIAGNOSIS — G4730 Sleep apnea, unspecified: Secondary | ICD-10-CM

## 2015-08-28 DIAGNOSIS — Z9989 Dependence on other enabling machines and devices: Secondary | ICD-10-CM

## 2015-08-28 MED ORDER — ARMODAFINIL 250 MG PO TABS
250.0000 mg | ORAL_TABLET | Freq: Every day | ORAL | Status: DC
Start: 2015-08-28 — End: 2015-10-27

## 2015-08-28 MED ORDER — SODIUM OXYBATE 500 MG/ML PO SOLN: 4500.0000 mg | Freq: Two times a day (BID) | ORAL | Status: DC

## 2015-08-28 NOTE — Addendum Note (Signed)
Addended by: Geronimo Running A on: 08/28/2015 02:19 PM   Modules accepted: Orders

## 2015-08-28 NOTE — Progress Notes (Signed)
PATIENT: Tanya Stanley DOB: 10-Dec-1965  REASON FOR VISIT: follow up- OSA on CPAP HISTORY FROM: patient  HISTORY OF PRESENT ILLNESS: Tanya Stanley is a 50 year old female with a history of obstructive sleep apnea on CPAP and narcolepsy. She returns today for an evaluation. Her CPAP download indicates that she used her machine 26 out of 30 days for compliance of 87%. She uses her machine greater than 4 hours 23 out of 30 days for compliance of 77%. On average she uses her machine 7 hours and 25 minutes. Her residual AHI is 0.8 on an inspiratory pressure of 10 cm of water and expiratory pressure of 6 cm of water. She reports that her Epworth sleepiness score is 10 and fatigue severity score is 48. She continues to use Xyrem and Nuvigil. She reports this is working very well for her. She states occasionally she'll have bad days where she feels severely fatigued. She denies any new medical issues.  Interval history from 08/28/2015 Tanya Stanley has excellent compliance reports again for the last 30 days her compliance has been 93% 4 hours of daily use 8 hours and 7 minutes on average. She has a residual AHI of only 0.9 very mild air leaks and her machine is set with an inspiratory pressure setting of 10 and expiratory pressure setting of 6 cm water. She's not just highly compliant she has significantly benefited from having sleep apnea treated she was also diagnosed with narcolepsy and she has responded very well to Xyrem. She announced today that she is leaving [place] and is moving to the [place] area at the [employer]. I have them to look up if we have a provider through the [facility] that'll follow her for pulse apnea and narcolepsy. Epworth sleepiness score was endorsed at 9 points. I would like for the patient to be followed at the [facility] and I would prefer for her to have a sleep neurologist again.  HISTORY 12/09/14: Tanya Stanley is a 50 y.o. female  here as a referral from Dr. Barrie Folk for Migraines. They started at least 15 years ago. Had a migraine even as a child triggered by a smell. She gets them behind both eyes, throbbing. Light is a trigger. Weather is a trigger as well. Fluorescent light is a trigger. Can be severe 10/10. +nausea and vomiting. Can go up to 5 straight days. Needs a dark room. Tried Imitrex, relpax, toradol, tramadol, flexeril. Never tried Botox. She has a bad one every month at most. Her blood pressure goes up because she has so much anxiety when she has a bad migraine. Denies focal symptoms durin the migraine. No vision changes. No hearing changes. She has excessive daytime fatigue and was referred to Dr. Brett Fairy. She has had a headache for days now and needs some relief. No aura. Has muscular neck pain with the headaches. Personally reviewed CT of the brain, no ischemic areas, no masses or edema, normal.  She was originally referred to me in the year 2014 and underwent a sleep study for excessive daytime sleepiness.  The patient at the time endorsed the Epworth sleepiness score at 20 points but also the Beck's depression inventory at 13 points. At the time of the study her BMI was 37 and her neck circumference 16 inches she was on Wellbutrin, Xanax, Lexapro, Imitrex etc. etc.  Patient had an AHI of 27.9 RDI of 36.9. Supine AHI was not elevated, REM AHI was not elevated. Lowest  oxygen saturation was 88% but only 1.4 minutes of desats. Titrated to 5 cm water. Shortly afterwards she still was not able to improve her daytime sleepiness while on CPAP so an MS LT was performed this patient was diagnosed with narcolepsy. She also underwent the HLA blood test. Which was negative. 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 She was titrated to Xyrem after not responding to other medications as well Xyrem has helped her stay alert in daytime she reduced her Epworth sleepiness score to 7  and her fatigue severity is 44. She has good nocturnal sleep now sound and no longer vivid or threatening dreams. There is she says that she has no cataplexy.  The [name] is provided through patient assintence program, the nuvigil is additional taken daily.    REVIEW OF SYSTEMS: Out of a complete 14 system review of symptoms, the patient complains only of the following symptoms, and all other reviewed systems are negative.  Fatigue, environmental allergies, back pain, apnea, daytime sleepiness, depression, nervous/anxious  How likely are you to doze in the following situations: 0 = not likely, 1 = slight chance, 2 = moderate chance, 3 = high chance  Sitting and Reading? 3 Watching Television? 2 Sitting inactive in a public place (theater or meeting)?0 Lying down in the afternoon when circumstances permit?0 Sitting and talking to someone?3 Sitting quietly after lunch without alcohol?0 In a car, while stopped for a few minutes in traffic?1 As a passenger in a car for an hour without a break?0  Total = 9   ALLERGIES: No Known Allergies  HOME MEDICATIONS: Outpatient Prescriptions Prior to Visit  Medication Sig Dispense Refill  . ALPRAZolam (XANAX) 0.5 MG tablet Take 0.5 tablets (0.25 mg total) by mouth 2 (two) times daily as needed for anxiety. Take at start of migraine for anxiety. May worsen daytime fatigue and somnolence, do not take regularly 15 tablet 3  . Armodafinil (NUVIGIL) 250 MG tablet Take 1 tablet (250 mg total) by mouth daily. 90 tablet 1  . calcium-vitamin D (OSCAL) 250-125 MG-UNIT per tablet Take 2 tablets by mouth daily.    Marland Kitchen escitalopram (LEXAPRO) 10 MG tablet Take 10 mg by mouth daily.     . ferrous sulfate 325 (65 FE) MG tablet Take 325 mg by mouth daily with breakfast.    . fluticasone (FLONASE) 50 MCG/ACT nasal spray Place 2 sprays into the nose daily.    . meloxicam (MOBIC) 15 MG tablet Take 15 mg by mouth daily.  1  . Multiple Vitamins-Minerals (MULTIVITAMIN  PO) Take 1 tablet by mouth daily.    . Sodium Oxybate 500 MG/ML SOLN Take 9 mLs (4,500 mg total) by mouth 2 (two) times daily. 1 Bottle 5  . SUMAtriptan (IMITREX) 20 MG/ACT nasal spray Place 1 spray (20 mg total) into the nose every 2 (two) hours as needed for migraine or headache. May repeat in 2 hours if headache persists or recurs. 1 Inhaler 6  . valACYclovir (VALTREX) 1000 MG tablet Take 1,000 mg by mouth as needed.    . Vitamin B Complex-C CAPS Take 1 capsule by mouth daily.    Marland Kitchen buPROPion (WELLBUTRIN XL) 300 MG 24 hr tablet Take 300 mg by mouth daily.     Facility-Administered Medications Prior to Visit  Medication Dose Route Frequency Provider Last Rate Last Dose  . valproate (DEPACON) 1,000 mg in sodium chloride 0.9 % 100 mL IVPB  1,000 mg Intravenous Continuous Melvenia Beam, MD  Stopped at 07/09/14 1649    PAST MEDICAL HISTORY: Past Medical History  Diagnosis Date  . EDS (Ehlers-Danlos syndrome)   . Migraine   . Anxiety and depression   . LBP (low back pain)   . Obstructive apnea     PSG 11-06-12, AHI 27.9 , RDI 36.9 , bipap  10/6 cm   . Hypersomnia, persistent     continued Epworth 20 and higher on BIPAP>   . Migraine   . BiPAP (biphasic positive airway pressure) dependence 05/27/2014    PAST SURGICAL HISTORY: Past Surgical History  Procedure Laterality Date  . Toe surgery  1985    ingrown toenail  . Knee surgery  1989  . Vaginal hysterectomy  2005    FAMILY HISTORY: Family History  Problem Relation Age of Onset  . Ovarian cancer Mother     SOCIAL HISTORY: Social History   Social History  . Marital Status: Single    Spouse Name: N/A  . Number of Children: 0  . Years of Education: BA   Occupational History  . 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 Smoking status: Current Every Day Smoker -- 0.50 packs/day for 20 years    Types: Cigarettes  . Smokeless tobacco: Never Used  . Alcohol Use: No  . Drug Use: No  . Sexual  Activity: Not on file   Other Topics Concern  . Not on file   Social History Narrative   Pt lives at home alone. Single. And with her dog.   Caffeine Use: 1-2 cups daily   Right handed.   FT- QC specialist   BS Chemistry      PHYSICAL EXAM  Filed Vitals:   08/28/15 1337  BP: 152/84  Pulse: 78  Resp: 20  Height: '5\' 6"'$  (1.676 m)  Weight: 231 lb (104.781 kg)   Body mass index is 37.3 kg/(m^2).  Generalized: Well developed, in no acute distress  Neck: Circumference 17 inches, Mallampati 4+  Neurological examination  Mentation: Alert oriented to time, place, history taking. Follows all commands speech and language fluent Cranial nerve II-XII: Pupils were equal round reactive to light. Extraocular movements were full, visual field were full on confrontational test. Facial sensation and strength were normal. Uvula tongue midline. Head turning and shoulder shrug  were normal and symmetric. Motor: The motor testing reveals 5 over 5 strength of all 4 extremities. Good symmetric motor tone is noted throughout.  Sensory: Sensory testing is intact to soft touch on all 4 extremities. No evidence of extinction is noted.  Coordination: Cerebellar testing reveals good finger-nose-finger and heel-to-shin bilaterally.  Gait and station: Gait is normal. Tandem gait is normal. Romberg is negative. No drift is seen.  Reflexes: Deep tendon reflexes are symmetric and normal bilaterally.   DIAGNOSTIC DATA (LABS, IMAGING, TESTING) - I reviewed patient records, labs, notes, testing and imaging myself where available.  No results found for: WBC, HGB, HCT, MCV, PLT    Component Value Date/Time   NA 141 12/09/2014 1601   K 4.3 12/09/2014 1601   CL 105 12/09/2014 1601   CO2 19 12/09/2014 1601   GLUCOSE 97 12/09/2014 1601   BUN 15 12/09/2014 1601   CREATININE 0.60 12/09/2014 1601   CALCIUM 9.2 12/09/2014 1601   PROT 6.5 12/09/2014 1601   ALBUMIN 4.4 12/09/2014 1601   AST 12 12/09/2014 1601    ALT 16 12/09/2014 1601   ALKPHOS 58 12/09/2014 1601   BILITOT <0.2 12/09/2014  [place] 12/09/2014 1601   GFRAA 124 12/09/2014 1601      ASSESSMENT AND PLAN 50 y.o. year old female  has a past medical history of EDS (Ehlers-Danlos syndrome); Migraine; Anxiety and depression; LBP (low back pain); Obstructive apnea; Hypersomnia, persistent; Migraine; and BiPAP (biphasic positive airway pressure) dependence (05/27/2014). here with:  1. Obstructive sleep apnea on CPAP 2. Narcolepsy on XYREM and Modafinil , need CMET q 3 month .  Overall the patient is doing well. Her compliance with CPAP is excellent.  She was encouraged to continue using CPAP nightly.  She will continue on Xyrem and Nuvigil. She has a physical scheduled in the follow weeks with her PCP- she will have bloodwork completed there. I have provided her with our fax number she will have her lab results faxed to our office once completed. She will follow-up in [place] with a new provider.    Jennessa Trigo, MD   08/28/2015, 1:46 PM [facility] [address], [place], [place] [address] [phone]

## 2015-08-28 NOTE — Patient Instructions (Signed)
I will refer Mrs. Hada to  sleep neurologist in the [facility].

## 2015-08-29 LAB — COMPREHENSIVE METABOLIC PANEL
ALT: 16 IU/L (ref 0–32)
AST: 18 IU/L (ref 0–40)
Albumin/Globulin Ratio: 1.7 (ref 1.1–2.5)
Albumin: 4.1 g/dL (ref 3.5–5.5)
Alkaline Phosphatase: 54 IU/L (ref 39–117)
BUN/Creatinine Ratio: 14 (ref 9–23)
BUN: 10 mg/dL (ref 6–24)
Bilirubin Total: 0.3 mg/dL (ref 0.0–1.2)
CO2: 21 mmol/L (ref 18–29)
Calcium: 9 mg/dL (ref 8.7–10.2)
Chloride: 103 mmol/L (ref 96–106)
Creatinine, Ser: 0.73 mg/dL (ref 0.57–1.00)
GFR calc Af Amer: 112 mL/min/{1.73_m2} (ref >59)
GFR calc non Af Amer: 97 mL/min/{1.73_m2} (ref >59)
Globulin, Total: 2.4 g/dL (ref 1.5–4.5)
Glucose: 121 mg/dL — ABNORMAL HIGH (ref 65–99)
Potassium: 3.8 mmol/L (ref 3.5–5.2)
Sodium: 142 mmol/L (ref 134–144)
Total Protein: 6.5 g/dL (ref 6.0–8.5)

## 2015-08-29 LAB — CBC WITH DIFFERENTIAL/PLATELET
Basophils Absolute: 0.1 10*3/uL (ref 0.0–0.2)
Basos: 0 %
EOS (ABSOLUTE): 0.3 10*3/uL (ref 0.0–0.4)
Eos: 2 %
Hematocrit: 39.8 % (ref 34.0–46.6)
Hemoglobin: 13.3 g/dL (ref 11.1–15.9)
Immature Grans (Abs): 0 10*3/uL (ref 0.0–0.1)
Immature Granulocytes: 0 %
Lymphocytes Absolute: 3.2 10*3/uL — ABNORMAL HIGH (ref 0.7–3.1)
Lymphs: 29 %
MCH: 30.8 pg (ref 26.6–33.0)
MCHC: 33.4 g/dL (ref 31.5–35.7)
MCV: 92 fL (ref 79–97)
Monocytes Absolute: 1 10*3/uL — ABNORMAL HIGH (ref 0.1–0.9)
Monocytes: 9 %
Neutrophils Absolute: 6.6 10*3/uL (ref 1.4–7.0)
Neutrophils: 60 %
Platelets: 299 10*3/uL (ref 150–379)
RBC: 4.32 x10E6/uL (ref 3.77–5.28)
RDW: 13.1 % (ref 12.3–15.4)
WBC: 11.1 10*3/uL — ABNORMAL HIGH (ref 3.4–10.8)

## 2015-10-27 ENCOUNTER — Telehealth: Payer: Self-pay | Admitting: Neurology

## 2015-10-27 DIAGNOSIS — G4710 Hypersomnia, unspecified: Secondary | ICD-10-CM

## 2015-10-27 DIAGNOSIS — Z9989 Dependence on other enabling machines and devices: Secondary | ICD-10-CM

## 2015-10-27 DIAGNOSIS — G4730 Sleep apnea, unspecified: Secondary | ICD-10-CM

## 2015-10-27 DIAGNOSIS — G4733 Obstructive sleep apnea (adult) (pediatric): Secondary | ICD-10-CM

## 2015-10-27 MED ORDER — ARMODAFINIL 250 MG PO TABS: 250.0000 mg | ORAL_TABLET | Freq: Every day | ORAL | Status: DC

## 2015-10-27 NOTE — Telephone Encounter (Signed)
Patient is calling and states she has moved to [place], [place] and cannot see a neurologist for a few months out.  She states she needs a refill for Rx armodafinil 250 mg tablets. She would like a 30 day supply [facility] [place] [place] [facility] [phone].  She is also requesting [facility] [phone]. The patient's new insurance is: [employer] @phone  [phone]. QV#[id number]. Patient's new address is: [address] [place], [place], [place]  [id number]. Phone: [phone] Please call.

## 2015-10-27 NOTE — Addendum Note (Signed)
Addended by: Geronimo RunningINKINS, Pascha Fogal A on: 10/27/2015 01:38 PM   Modules accepted: Orders

## 2015-10-27 NOTE — Telephone Encounter (Signed)
Nuvigil 30 day RX [facility]ed [facility] [place] [place]. Nuvigil 90 day [facility]. Received a receipt of confirmation

## 2015-10-28 MED ORDER — ARMODAFINIL 250 MG PO TABS: 250.0000 mg | ORAL_TABLET | Freq: Every day | ORAL | Status: DC

## 2015-10-28 NOTE — Telephone Encounter (Signed)
Pt called back today said the [facility] via fax  but [facility] [place] won't accept it as a fax as a valid RX. So could [facility] send it electronic or send thru the mail-she would pay for it to be over-nighted. Please call Micah or pharmacist (p) [phone]. [phone] can be reached at [phone].

## 2015-10-28 NOTE — Telephone Encounter (Signed)
Pt returned Kristen's call. msg was relayed and she was so relieved!

## 2015-10-28 NOTE — Addendum Note (Signed)
Addended by: Melvyn NovasHMEIER, Gearl Baratta on: 10/28/2015 04:22 PM   Modules accepted: Orders

## 2015-10-28 NOTE — Telephone Encounter (Signed)
Dr. Vickey Hugerohmeier mailed the RX for nuvigil to pt's pharmacy in [place].  I called pt to advise her of this. No answer, left a message asking her to call us back. If pt calls back, please advise her of this.

## 2015-10-28 NOTE — Telephone Encounter (Signed)
Spoke to pt and advised her that the 30 day nuvigil RX was [facility]ed to [facility] [place] and the 90 day supply [facility]. Pt verbalized understanding. Pt asked if I had received a prior auth for these meds, which I have not, but I told the pt that I would look out for them. Pt verbalized understanding.

## 2015-10-28 NOTE — Telephone Encounter (Signed)
I [facility] the [facility] [place].   The [facility] in [place] will not accept faxed or verbal orders for Nuvigil (schedule IV drug). If a verbal order is given, they will only dispense a 3-day supply. The only accept mailed or electronic RXs. We cannot send electronic RXs for schedule IV drugs through [facility]. I am uncomfortable mailing a schedule IV RX.   Of note, pt has a nuvigil RX for 90 days that was sent to [facility] as well. She just wants the RX at the [facility] in [place] so she can have the nuvigil while she is waiting for the mail order nuvigil to arrive. She cannot get in with a neurologist in [place] for several months.  Do you have any recommendations?

## 2015-11-03 NOTE — Telephone Encounter (Signed)
Pt called today said medication needs Prior Auth/[facility] phone # [phone] ext [phone].

## 2015-11-04 NOTE — Telephone Encounter (Signed)
Prior auth completed and faxed to [employer]. Takes up to 3 days for a response.

## 2015-11-10 NOTE — Telephone Encounter (Signed)
PA for armodafinil approved by [employer] from 11/04/2015 to 11/03/2016. [facility] notified.

## 2015-11-18 ENCOUNTER — Telehealth: Payer: Self-pay

## 2015-11-18 NOTE — Telephone Encounter (Signed)
Spoke to pt and advised her that her xyrem needs a pa and I have completed this PA and sent to to [employer]. Pt verbalized understanding. She says that she has not made an appt with a neurologist yet. She first has an appt with a PCP on 4/25 and from there will get a referral to a sleep neurologist.

## 2015-11-19 NOTE — Telephone Encounter (Signed)
Xyrem pa was approved by [employer]. Approved from 11/19/2015 to 11/18/2016.  I called pt to advise her. No answer, left a message asking her to call me back. Please advise pt of this information when pt calls back.  XYREM REMS program has also been informed.

## 2015-11-20 NOTE — Telephone Encounter (Signed)
Patient returned Kristen's call, advised Xyrem PA was approved.

## 2015-11-28 ENCOUNTER — Other Ambulatory Visit: Payer: Self-pay | Admitting: Neurology

## 2015-12-03 ENCOUNTER — Encounter: Payer: Self-pay | Admitting: Gastroenterology

## 2015-12-03 ENCOUNTER — Other Ambulatory Visit: Payer: Self-pay | Admitting: Internal Medicine

## 2015-12-03 LAB — CBC AND DIFFERENTIAL
Baso # K/uL: 0.1 10*3/uL (ref 0.0–0.1)
Basophil %: 1 %^% (ref 0.1–1.2)
Eos # K/uL: 0.4 10*3/uL (ref 0.0–0.4)
Eosinophil %: 4.8 %^% (ref 0.7–5.8)
Hematocrit: 38 %^% (ref 34.1–44.9)
Hemoglobin: 13.1 g/dL^g/dL (ref 11.2–15.7)
Immature Granulocytes Absolute: 0.03 10*3/uL (ref 0.0–0.2)
Immature Granulocytes: 0.3 %^% (ref 0.0–2.0)
Lymph # K/uL: 2.8 10*3/uL (ref 1.2–3.7)
Lymphocyte %: 30.7 %^% (ref 19.3–51.7)
MCH: 31.2 pg^pg (ref 25.6–32.2)
MCHC: 34.5 g/dL^g/dL (ref 32.2–35.5)
MCV: 90.5 fL^fL (ref 79.4–94.8)
Mono # K/uL: 1.1 10*3/uL — ABNORMAL HIGH (ref 0.2–0.9)
Monocyte %: 12.3 %^% (ref 4.7–12.5)
Neut # K/uL: 4.7 10*3/uL (ref 1.6–6.0)
Nucl RBC # K/uL: 0 /100 WBC^/100 WBC (ref 0.0–0.2)
Platelets: 286 10*3/uL (ref 182–369)
RBC Distribution Width-SD: 42.6 fL^fL (ref 36.4–46.3)
RBC: 4.2 10*6/uL (ref 3.93–5.22)
RDW: 13 %^% (ref 11.7–14.4)
Seg Neut %: 50.9 %^% (ref 34.0–71.1)
WBC: 9.2 10*3/uL (ref 4.0–10.0)

## 2015-12-03 LAB — COMPREHENSIVE METABOLIC PANEL
ALT: 23 U/L^U/L (ref 0–35)
AST: 23 U/L^U/L (ref 0–35)
Albumin: 4 g/dL^g/dL (ref 3.5–5.2)
Alk Phos: 84 U/L^U/L (ref 35–105)
Anion Gap: 12 (ref 7–16)
Bilirubin,Total: 0.2 mg/dL^mg/dL (ref 0.0–1.2)
CO2: 24 mmol/L^mmol/L (ref 20–28)
Calcium: 9 mg/dL^mg/dL (ref 8.6–10.2)
Chloride: 105 mmol/L^mmol/L (ref 96–108)
Creatinine: 0.7 mg/dL^mg/dL (ref 0.51–0.95)
GFR,Black: 107 mL/min/{1.73_m2}
GFR,Caucasian: 89 mL/min/{1.73_m2}
Glucose: 106 mg/dL^mg/dL — ABNORMAL HIGH (ref 60–99)
Lab: 23 mg/dL^mg/dL — ABNORMAL HIGH (ref 6–20)
Potassium: 4.3 mmol/L^mmol/L (ref 3.4–4.7)
Sodium: 141 mmol/L^mmol/L (ref 133–145)
Total Protein: 6.5 g/dL^g/dL (ref 6.3–7.7)

## 2015-12-03 LAB — LIPID PANEL
Chol/HDL Ratio: 2.7 (ref 1.0–3.5)
Cholesterol: 206 mg/dL^mg/dL — ABNORMAL HIGH (ref 0–199)
HDL: 76 mg/dL^mg/dL — ABNORMAL HIGH (ref 40–60)
LDL Calculated: 113 mg/dL^mg/dL (ref 0–129)
Triglycerides: 83 mg/dL^mg/dL (ref 0–149)

## 2015-12-03 LAB — TSH: TSH: 2.14 uIU/mL^uIU/mL (ref 0.27–4.20)

## 2015-12-04 ENCOUNTER — Other Ambulatory Visit: Payer: Self-pay | Admitting: Neurology

## 2015-12-05 ENCOUNTER — Encounter: Payer: Self-pay | Admitting: Gastroenterology

## 2015-12-08 ENCOUNTER — Other Ambulatory Visit: Payer: Self-pay | Admitting: Neurology

## 2015-12-08 DIAGNOSIS — Z9989 Dependence on other enabling machines and devices: Secondary | ICD-10-CM

## 2015-12-08 DIAGNOSIS — G4730 Sleep apnea, unspecified: Secondary | ICD-10-CM

## 2015-12-08 DIAGNOSIS — G4733 Obstructive sleep apnea (adult) (pediatric): Secondary | ICD-10-CM

## 2015-12-08 DIAGNOSIS — G4710 Hypersomnia, unspecified: Secondary | ICD-10-CM

## 2015-12-08 MED ORDER — ARMODAFINIL 250 MG PO TABS: 250.0000 mg | ORAL_TABLET | Freq: Every day | ORAL | Status: DC

## 2015-12-08 MED ORDER — SODIUM OXYBATE 500 MG/ML PO SOLN: 4500.0000 mg | Freq: Two times a day (BID) | ORAL | Status: AC

## 2015-12-08 NOTE — Telephone Encounter (Signed)
Pt called back. Please call when you have time.

## 2015-12-08 NOTE — Telephone Encounter (Signed)
Spoke to pt and advised her that her xyrem and nuvigil were refilled by Dr. Vickey Hugerohmeier and the nu[facility]igil was mailed to [facility] in [place]. Pt was also instructed to get her blood drawn and the CBC and CMP results faxed to us. Pt verbalized understanding.

## 2015-12-08 NOTE — Telephone Encounter (Signed)
I called pt to discuss. No answer, left a message asking her to call me back.  Dr. Vickey Hugerohmeier refilled the nuvigil and xyrem. Pts nuvigil will have to be mailed since [place] does not accept faxed nuvigil RXs. Pt will need to get a CMP and CBC faxed to us.

## 2015-12-08 NOTE — Telephone Encounter (Signed)
Pt is currently taking xyrem and nuvigil. Pt has moved to [place]. Last seen by Dr. Vickey Hugerohmeier in 08/2015.  Pt states that she cannot get in to see a sleep specialist until September.  Do you want to continue refilling nuvigil and xyrem as long as pt faxes us her labs?

## 2015-12-08 NOTE — Telephone Encounter (Signed)
Pt sts she can not be seen with sleep neurologist in [place] until September. She is wanting to know if she needs to schedule an appt with Dr Vickey Hugerohmeier to get her medications and labs every few months. Can she get them in [place] and have it sent to Dr Vickey Hugerohmeier. sts she can fly down if necessary.  Also can not get 90 day supply on Armodafinil (NUVIGIL) 250 MG tablet, insurance will not pay for it, insurance will not pay for 90 supply on any medication. She has [employer] effective 10/08/15.  Member # [id number]  Grp# [id number]  BIN# [id number]. Please call

## 2015-12-11 ENCOUNTER — Other Ambulatory Visit: Payer: Self-pay | Admitting: Internal Medicine

## 2015-12-11 ENCOUNTER — Other Ambulatory Visit: Payer: Self-pay

## 2015-12-11 DIAGNOSIS — G4733 Obstructive sleep apnea (adult) (pediatric): Secondary | ICD-10-CM

## 2015-12-11 DIAGNOSIS — Z9989 Dependence on other enabling machines and devices: Secondary | ICD-10-CM

## 2015-12-11 DIAGNOSIS — G4710 Hypersomnia, unspecified: Secondary | ICD-10-CM

## 2015-12-11 DIAGNOSIS — G4730 Sleep apnea, unspecified: Secondary | ICD-10-CM

## 2015-12-11 LAB — HEMATOCRIT: Hematocrit: 39.4 %^% (ref 34.1–44.9)

## 2015-12-11 LAB — TIBC: TIBC: 266 ug/dL^ug/dL (ref 250–450)

## 2015-12-11 LAB — FERRITIN: Ferritin: 131.2 ng/mL^ng/mL — ABNORMAL HIGH (ref 10.0–120.0)

## 2015-12-11 LAB — IRON: Iron: 79 ug/dL^ug/dL (ref 34–165)

## 2015-12-11 NOTE — Telephone Encounter (Signed)
Pt's labs were faxed to us from [place]CP [place], [place]. Dr. Vickey Hugerohmeier reviewed them and said they look ok, pt can continue xyrem. I spoke to pt and advised her of this. Pt verbalized understanding.

## 2015-12-15 MED ORDER — ARMODAFINIL 250 MG PO TABS: 250.0000 mg | ORAL_TABLET | Freq: Every day | ORAL | Status: DC

## 2015-12-15 NOTE — Addendum Note (Signed)
Addended by: Geronimo RunningINKINS, Tiburcio Linder A on: 12/15/2015 09:59 AM   Modules accepted: Orders

## 2015-12-15 NOTE — Telephone Encounter (Signed)
Pt called said she has not rec'd refills yet. She has been out of medication. She is inquiring if her PCP in [place] could prescribe it. Please call at [phone]

## 2015-12-15 NOTE — Telephone Encounter (Signed)
I spoke to pt and advised her that I spoke to the pharmacist at her [facility] and was able to call in a 3 days supply of the nuvigil and they are going to check their mail again for the nuvigil RX for 30 days. Pt will call her PCP to see if he can take over prescribing the nuvigil but will let us know if our office needs to continue prescribing the nuvigil.

## 2015-12-15 NOTE — Telephone Encounter (Signed)
I spoke to pt. The RX for nuvigil was mailed last week to the [facility] [place], [place]. She says that the pharmacy still has not received it. I advised her that I can call her pharmacy and see what I can do. Pt verbalized understanding.  I spoke to ToxeyMike, Teacher, early years/prepharmacist at [facility] [place]. He has not seen the RX for nuvigil yet but will check. He advised me that I can give him a verbal order for the nuvigil 250 tablet daily for three days but I will also have to mail him a hard script for it as well to reflect the 3 days. I will print the RX and mail again to [facility] [place]. I verified the address with Kathlene NovemberMike.

## 2015-12-17 NOTE — Telephone Encounter (Signed)
I mailed a 30 day supply of nuvigil to the pt's [facility] pharmacy on 12/08/15. I will send a request to Dr. Vickey Hugerohmeier to see if she is comfortable refilling it again and I will mail it again.

## 2015-12-17 NOTE — Addendum Note (Signed)
Addended by: Geronimo RunningINKINS, Lakynn Halvorsen A on: 12/17/2015 05:32 PM   Modules accepted: Orders

## 2015-12-17 NOTE — Telephone Encounter (Signed)
Pt called sts RX for nuvigil has not been rec'd by [facility] in [place]. She said PCP in [place] is not comfortable prescribing the medication as it is a controlled substance. She has one pill left. Please call

## 2015-12-18 MED ORDER — ARMODAFINIL 250 MG PO TABS: 250.0000 mg | ORAL_TABLET | Freq: Every day | ORAL | Status: DC

## 2015-12-18 NOTE — Telephone Encounter (Signed)
I called pt to discuss. No answer, left a message asking her to call me back. 

## 2015-12-18 NOTE — Telephone Encounter (Signed)
Spoke to pt and advised her that I will mail another nuvigil RX to her home and that Dr. Vickey Hugerohmeier approved another RX. Verified her address. Not sure why the nuvigil RX is taking so long to get to [place]. Pt verbalized understanding.

## 2015-12-25 ENCOUNTER — Telehealth: Payer: Self-pay | Admitting: Neurology

## 2015-12-25 NOTE — Telephone Encounter (Signed)
Noted, thank you, I'm glad she finally received both RXs.

## 2015-12-25 NOTE — Telephone Encounter (Signed)
FYI-Patient is calling stating she did receive Rx's for Armodafinil (NUVIGIL) 250 MG tablet x2. A returned call is not needed.

## 2016-01-21 ENCOUNTER — Telehealth: Payer: Self-pay | Admitting: Neurology

## 2016-01-21 MED ORDER — ARMODAFINIL 250 MG PO TABS: 250.0000 mg | ORAL_TABLET | Freq: Every day | ORAL | Status: DC

## 2016-01-21 NOTE — Telephone Encounter (Signed)
I spoke to pt. She wants a refill on nuvigil mailed to her home address in [place].  Pt says that she wants to stop the xyrem. She does not feel that it is helping any more. I advised her that we should discuss this with Dr. Vickey Hugerohmeier first before stopping. Pt verbalized understanding.  Pt also reports that she is also going to apply for disability because of her daytime sleepiness. It is hard for her to stay awake and function at work. As noted above, pt feels that xyrem is not helping. Pt is still waiting on an appt with a neurologist in [place] at this time.

## 2016-01-21 NOTE — Telephone Encounter (Signed)
I would strongly urge Tanya Stanley to not discontinue Xyrem until she has seen a local sleep specialist [place]

## 2016-01-21 NOTE — Telephone Encounter (Signed)
Pt called said she needs new RX for Armodafinil (NUVIGIL) 250 MG tablet . She is requesting RN to call.

## 2016-01-22 NOTE — Telephone Encounter (Signed)
I spoke to pt and advised her that Dr. Vickey Hugerohmeier strongly urges pt to not discontinue xyrem until she has seen a sleep specialist in [place]. Pt says that she will not discontinue the xyrem until she sees the sleep specialist on 05/05/2016. Pt may be able to see them sooner- she is on the wait list. I advised pt that a 30 day supply of nuvigil was mailed to her as well.Pt verbalized understanding.

## 2016-01-26 NOTE — Telephone Encounter (Signed)
Pt called said she did rec RX for nuvigil. FYI she said.

## 2016-02-17 ENCOUNTER — Telehealth: Payer: Self-pay | Admitting: Neurology

## 2016-02-17 MED ORDER — ARMODAFINIL 250 MG PO TABS: 250.0000 mg | ORAL_TABLET | Freq: Every day | ORAL | Status: DC

## 2016-02-17 NOTE — Telephone Encounter (Signed)
I spoke to pt and advised her that the RX for nuvigil was sent to be placed in the mail. Pt verbalized understanding and appreciation. She will call me when the RX arrives to her house.

## 2016-02-17 NOTE — Telephone Encounter (Signed)
Patient requesting refill of Armodafinil (NUVIGIL) 250 MG tablet Pharmacy: Pt is requesting it be mailed to her.  Pt is leaving the end of next week to go on a trip.

## 2016-02-24 NOTE — Telephone Encounter (Signed)
Pt called to let the office know she did receive her rx.

## 2016-03-25 ENCOUNTER — Other Ambulatory Visit: Payer: Self-pay | Admitting: Neurology

## 2016-03-25 ENCOUNTER — Telehealth: Payer: Self-pay | Admitting: Neurology

## 2016-03-25 MED ORDER — ARMODAFINIL 250 MG PO TABS: 250.0000 mg | ORAL_TABLET | Freq: Every day | ORAL | 0 refills | Status: AC

## 2016-03-25 NOTE — Telephone Encounter (Signed)
Patient requesting refill ofArmodafinil (NUVIGIL) 250 MG tablet sent to her address. Thank you

## 2016-03-25 NOTE — Telephone Encounter (Signed)
I called pt to let her know that the RX for nuvigil is in the mail. No answer, left a detailed message on her cell phone (per DPR) explaining this.

## 2016-03-25 NOTE — Telephone Encounter (Signed)
error 

## 2016-04-05 NOTE — Telephone Encounter (Signed)
Pt called and received her rx. She says thank you

## 2016-04-08 ENCOUNTER — Other Ambulatory Visit: Payer: Self-pay | Admitting: Internal Medicine

## 2016-04-08 LAB — LIPID PANEL
Chol/HDL Ratio: 2.6 ^^L (ref 1.0–3.5)
Cholesterol: 211 mg/dL — ABNORMAL HIGH (ref 0–199)
HDL: 80 mg/dL — ABNORMAL HIGH (ref 40–60)
LDL Calculated: 113 mg/dL (ref 0–129)
Triglycerides: 90 mg/dL (ref 0–149)

## 2016-04-08 LAB — COMPREHENSIVE METABOLIC PANEL
ALT: 17 U/L (ref 0–35)
AST: 18 U/L (ref 0–35)
Albumin: 4.1 g/dL (ref 3.5–5.2)
Alk Phos: 62 U/L (ref 35–105)
Anion Gap: 16 ^^L (ref 7–16)
Bilirubin,Total: 0.5 mg/dL (ref 0.0–1.2)
CO2: 21 mmol/L (ref 20–28)
Calcium: 9.2 mg/dL (ref 8.6–10.2)
Chloride: 102 mmol/L (ref 96–108)
Creatinine: 0.66 mg/dL (ref 0.51–0.95)
GFR,Black: 115 mL/min/{1.73_m2}
GFR,Caucasian: 95 mL/min/{1.73_m2}
Glucose: 100 mg/dL — ABNORMAL HIGH (ref 60–99)
Lab: 14 mg/dL (ref 6–20)
Potassium: 4.2 mmol/L (ref 3.4–4.7)
Sodium: 139 mmol/L (ref 133–145)
Total Protein: 7 g/dL (ref 6.3–7.7)

## 2016-04-08 LAB — HEMOGLOBIN A1C: Hemoglobin A1C: 5.6 % (ref 4.0–6.0)

## 2016-04-08 LAB — FERRITIN: Ferritin: 169.9 ng/mL — ABNORMAL HIGH (ref 10.0–120.0)

## 2016-04-27 ENCOUNTER — Telehealth: Payer: Self-pay

## 2016-04-27 NOTE — Telephone Encounter (Signed)
Called and spoke to Miss [name] at [facility] and let her know the patient will not be seen til 05/05/16- I will keep the release in my folder and fax them over as soon as the progress note is done

## 2016-05-03 ENCOUNTER — Telehealth: Payer: Self-pay | Admitting: Sleep Medicine

## 2016-05-03 NOTE — Telephone Encounter (Signed)
Confirmed 05/05/16 appt with patient

## 2016-05-04 ENCOUNTER — Ambulatory Visit: Payer: Self-pay | Admitting: Sleep Medicine

## 2016-05-05 ENCOUNTER — Ambulatory Visit: Payer: Self-pay | Admitting: Sleep Medicine

## 2016-05-05 ENCOUNTER — Telehealth: Payer: Self-pay

## 2016-05-05 ENCOUNTER — Encounter: Payer: Self-pay | Admitting: Sleep Medicine

## 2016-05-05 VITALS — BP 154/73 | HR 87 | Ht 65.5 in | Wt 225.0 lb

## 2016-05-05 DIAGNOSIS — G4733 Obstructive sleep apnea (adult) (pediatric): Secondary | ICD-10-CM | POA: Insufficient documentation

## 2016-05-05 MED ORDER — ARMODAFINIL 250 MG PO TABS *I*
250.0000 mg | ORAL_TABLET | Freq: Every morning | ORAL | 5 refills | Status: DC
Start: 2016-05-05 — End: 2016-08-12

## 2016-05-05 MED ORDER — CPAP MACHINE *A*
0 refills | Status: AC
Start: 2016-05-05 — End: ?

## 2016-05-05 NOTE — Telephone Encounter (Signed)
MEY- patient has chosen to get replacement supplies from [facility]. I will need  A script to fax in addition to her records. Thanks

## 2016-05-05 NOTE — Telephone Encounter (Signed)
[facility]         CPAP HomeCare Authorization    Patient Information:    Bonnie Faulkner  05/05/1966  [address]  [place] [id number]  [phone] (home)     CPAP Vendor:  [facility] - [phone]    Titration Date:      Follow Up Appt:     Type:   HOME    [employer]  [id number]      [name]

## 2016-05-05 NOTE — Progress Notes (Addendum)
[facility]   Dr. Valetta Fuller , M.D.    Bonnie Faulkner  [id number]    Dear Salvadore Dom, MD ,    Bonnie Faulkner Comes for evaluation of her complaint of "my doctor diagnosed me with apnea and idiopathic hypersomnia".    As you know, the patient is a 50 year old woman who is considering medical disability who has had many years of daytime sleepiness.  She tells me that matter how much sleep she is gotten the night before, she would feel tired and sleepy during the day.  She also had a history of snoring, but no apneas (in part because she did not have a regular bed partner).  At one point, her Epworth Sleepiness Scale score was 21.    The patient apparently had a sleep study which did not demonstrate obstructive sleep apnea.  She sought a second opinion in [place].  Based on this, she had a split-night study (11/06/12) which did demonstrate moderate OSA (AHI 28, minimum O2 86%).  She was titrated to a BiPAP of 10/6 cmH2O based on the second half of the recording (she did not tolerate CPAP).  She then had a polysomnogram on BiPAP followed by a multiple sleep latency test on 6/23-24/14 which demonstrated ongoing excessive daytime sleepiness (average sleep latency 4.8 minutes, no SOREM periods).    Based on this, the patient was started on BiPAP at home.  She's had a few technical concerned with it.  In particular, she tells me that poses somewhat irritating.  She uses nasal pillows which are generally comfortable.  She does not use her humidifier, but yet finds her breathing passages to be moist and noncongested.  She is not bothered by the fixed pressure of 10/6 cm of water.  She's not bothered by the noise.  She gets no skin irritation.  She does cleaning her CPAP equipment intermittently, and gets replacement supplies as needed (nasal pillows about 6 times a year).    We interrogated her BiPAP unit today.  It is working at 10/6 cm of water.  Average use almost 10 hours a day (this  includes nighttime and daytime use).  Reasonable air.  Calculated AHI 0.9 events per hour.    The patient tells me that despite regular BiPAP use, she continued to feel sleepy during the day.  As such, she tried Provigil (cause dry mouth), and then transition to Nuvigil currently 250 mg a day without side effects.  She tells me it does help her feel more focused during the day, although she is still sleepy.  She tells me she is drowsy reading or watching television.  She naps now usually once a day for up to 3 hours.  Naps are not particularly refreshing.  She is generally alert behind the wheel, although she does note that on 2 occasions she felt very drowsy, and almost panicked because of this.  There've been no motor vehicle accidents as a result.  Currently her Epworth Sleepiness Scale score is 15, even on armodafinil.    The patient also started Xyrem.  She hasn't found an improvement in daytime alertness, and doesn't like the way it makes her feel at night, so would like to discontinue.    The patient goes to bed around 11 PM.  Even without Xyrem  she falls asleep quickly.  She usually doesn't wake in the middle of the night.  She gets up at 7 AM, usually feeling rested, at least at first.  The patient tells me she isn't moving around very much at night.  She has chronic back pain, but has no symptoms of restless leg syndrome.    She does drink caffeine in the form of soda, about one a day.  No alcohol use.    She's been gaining weight, she estimates about 20 pounds in the last 5 years.    She has a history of depression, and is taking Lexapro for it.    She wears a bite guard for bruxism, and realizes that she needs a new one;  Hers is worn out.    ACTIVE PROBLEM LIST:     Patient Active Problem List   Diagnosis Code    OSA (obstructive sleep apnea) G47.33       REVIEW OF SYSTEMS:     Negative-hypertension, cardiac, lungs, liver, kidneys, diabetes, thyroid  Positive-chronic pain, migraine headaches,  obstructive sleep apnea, obesity, orthopedic concerns    MEDICAL HISTORY:     No past medical history on file.      MEDICATIONS:     Current Outpatient Prescriptions   Medication Sig Note    armodafinil (NUVIGIL) 250 MG tablet Take 250 mg by mouth daily 05/05/2016: Received from: External Pharmacy Received Sig: TAKE 1 TABLET BY MOUTH EVERY DAY    XYREM 500 MG/ML  05/05/2016: Received from: External Pharmacy    Escitalopram Oxalate (LEXAPRO PO) Take by mouth     multi-vitamin (MULTIVITAMIN) per tablet Take 1 tablet by mouth daily     calcium-vitamin D (OSCAL-500) 500-200 MG-UNIT per tablet Take 1 tablet by mouth 2 times daily     fluticasone (FLONASE) 50 MCG/ACT nasal spray 1 spray by Nasal route daily     ALPRAZolam (XANAX PO) Take by mouth as needed     valGANciclovir (VALCYTE) 450 MG tablet Take 900 mg by mouth daily Swallow whole. Take with food.     SUMAtriptan (IMITREX) 20 MG/ACT nasal spray 1 spray by Nasal route as needed for Migraine   Inhale into one nostril at onset of headache. May repeat once in 2 hours.      No current facility-administered medications for this visit.        ALLERGIES:     No Known Allergies (drug, envir, food or latex)    SOCIAL HISTORY:     The patient lives in [place], having moved back to [place] from her time in [place].  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  She used to work in [employer], but is now applied for disability.  She smokes half pack of cigarettes daily, and although she would like to quit, has no plan for this now.  No alcohol use.      FAMILY HISTORY:   Brother-obstructive sleep apnea, on CPAP      PHYSICAL EXAM:     Vitals:    05/05/16 0925   BP: 154/73   Pulse: 87   Weight: 102.1 kg (225 lb)   Height: 1.664 m (5' 5.5")       Pain    05/05/16 0925   PainSc:   2   PainLoc: Back       NC:   17 inches  Gen: NAD, pleasant and interactive  Car:  RRR, nl S1/S2  Lungs:  CTA bilaterally  Neurologic:   Pupils 4-48mm bilaterally, brisk gag reflex  HEENT:   Nasal:  Narrow  Soft palate:  Low lying  Hard palate:  OK  Mallampati:  3  Oral Cavity:  OK  Tonsils:  1+  Tongue:  large  Bite: end-end  Molars:  worn    Extremities:  No LE edema    IMPRESSION:     50 year old woman comes for evaluation.  1.  Sleep disordered breathing.  The patient has moderate obstructive sleep apnea based on sleep testing from 2014.  Her symptoms of snoring and some of her daytime sleepiness are improved on BiPAP, although she is having some technical difficulties with the therapy.  Even with adequate control of her sleep-disordered breathing, the patient remains sleepy and unfocused.  It is questionable whether this represents idiopathic hypersomnia or residual daytime sleepiness despite adequate treatment of OSA.  Nonetheless, she is partially responding to Nuvigil at maximum dose.  She could consider stimulants in the future, but with an eye to her hypertension.    PLAN:   1.  I have written a prescription for replacement supplies on her BiPAP.  I recommended she cleaned her equipment more regularly, and gave her some strategies for hose management.  2.  I encouraged efforts at tobacco cessation.  I also recommended she never drive while sleepy.  3.  For now I will refill her Nuvigil 250 mg daily.  We briefly discussed possible replacement with stimulants, and she will research this.  We will discontinue Xyrem altogether due to side effects and ineffectiveness, as well as no [facility] for [facility].  4.  The patient will follow-up with me in approximately 12-18 months, earlier if necessary.  We could consider actigraphy in the future, but will table this for now.    Thank you for allowing me to participate in your patient's care.  Please do not hesitate to contact me with questions or concerns.        Fredrik CoveMICHAEL E Justan Gaede, MD    *This note was dictated using Dragon Medical Enterprise 10.1, reasonable efforts were made to correct for any  dictation errors.

## 2016-05-05 NOTE — Patient Instructions (Addendum)
[facility]: Drowsy Driving    Drowsy driving is a common - and often deadly - danger on our roads. It occurs when you are too tired to remain alert while operating a motor vehicle. As a result you may struggle to stay focused on the road and fail to practice safe driving techniques.     Feeling drowsy makes you easily distracted. You may be unable to focus and pay attention. This can lead to increased errors and a decreased ability to detect and correct mistakes. Your thinking can be slowed and your response time delayed. People who are sleepy also tend to be unaware that their performance and alertness are suffering.     Drowsy driving can be as deadly as drunk driving, putting yourself, your passengers and other drivers in danger. Common mistakes while driving drowsy include:  Following too closely behind the vehicle in front of you   Failing to realize that you are driving too fast   Drifting into another lane or off the road without knowing it   Falling asleep behind the wheel and losing control of your vehicle     Accidents caused by drowsy driving tend to share some common features. These include:  Time of Day: Accidents often occur late at night or early in the morning during the body’s natural sleep period. Accidents also can occur when sleepiness peaks after lunch in the middle of the afternoon.  Speed: Accidents often occur at high speeds on highways and other major roadways.  Solitude: Accidents often involve one vehicle that veers off the road. Drivers often are alone in the vehicle.  No Brakes: Drivers often make no attempt to apply the brakes or avoid the crash.  Severity: Accidents often cause severe injuries or death.    Anyone who doesn’t get enough sleep can drive drowsy. But certain people have a higher risk than others, including people who:  Take medications that cause sleepiness   Drink alcohol, which increases drowsiness   Work night shifts or rotating shifts   Have a sleep  disorder       Drowsy Driving Statistics    Unfortunately, drowsy driving occurs all too often. The U. [place]. National Highway Traffic Safety Administration (NHTSA) reports that drowsy driving causes more than 100,000 crashes a year. These accidents result in an estimated 40,000 injuries and 1,550 deaths.    In reality, all of these numbers are probably much higher than the statistics show. It is difficult to know how drowsy someone was prior to an accident. Unlike drunk driving, there is no “breathalyzer” test for drowsiness. So unless a driver admits falling asleep, drowsy driving often goes unreported.   In 2002 The [employer] surveyed more than 4,000 drivers in the [place] Survey results show that 37 percent of drivers reported nodding off or falling asleep at least once while driving. Males were almost twice as likely as females to report that they had driven drowsy. [employer] estimated that 7.5 million drivers had nodded off while driving in the past month.    The only sure way to detect drowsy driving would be to monitor the alertness of people as they drive. That’s exactly what the [employer] did in a naturalistic driving study. They installed video cameras and sensors in 100 vehicles that were driven by ordinary people every day for one year. The study gathered nearly 2 million miles of real-world driving data. It found that driver drowsiness was a contributing factor in at   least 20 percent of all crashes. The researchers estimated that you are four to six times more likely to have a crash or near crash when you are driving drowsy.          -Consider Ritalin, Adderall, Concerta, or Vyvanse in the future

## 2016-05-06 ENCOUNTER — Telehealth: Payer: Self-pay

## 2016-05-06 NOTE — Telephone Encounter (Signed)
Faxed records from the office visit on 05/05/16- billed $10.00 faxed to [phone]

## 2016-05-06 NOTE — Telephone Encounter (Addendum)
Faxed script for replacement supplies to [facility] with visit from 9.28.2017.

## 2016-05-10 ENCOUNTER — Telehealth: Payer: Self-pay

## 2016-05-10 NOTE — Telephone Encounter (Signed)
Ms. Bonnie Faulkner was calling as Bonnie Faulkner has reached out to her regarding the order for the CPAP.  They are requiring the prescription be sent to them along with the results of the patients most recent sleep study.  Patient did not have a fax number for Bonnie Faulkner.    Any further questions patient can be reached at [phone].

## 2016-05-10 NOTE — Telephone Encounter (Signed)
Script and information was faxed 9.28.2017. I have refaxed information to [facility] including study and recent visit.

## 2016-05-11 ENCOUNTER — Telehealth: Payer: Self-pay

## 2016-05-11 NOTE — Telephone Encounter (Signed)
I received a refill request for pt's xyrem. Pt should have transitioned her care to a sleep specialist in [place] at this time. I called pt to discuss. No answer, left a message asking her to call me back.

## 2016-05-12 NOTE — Telephone Encounter (Signed)
Pt returned my call. She reports that she saw a sleep specialist in [place] and they agreed to d/c the xyrem. Pt does not need anything further from us at this time.

## 2016-05-26 ENCOUNTER — Telehealth: Payer: Self-pay

## 2016-05-26 NOTE — Telephone Encounter (Signed)
Received the payment of $10.00 from [facility] - sent for deposit

## 2016-08-11 ENCOUNTER — Telehealth: Payer: Self-pay

## 2016-08-11 DIAGNOSIS — G4733 Obstructive sleep apnea (adult) (pediatric): Secondary | ICD-10-CM

## 2016-08-11 NOTE — Telephone Encounter (Signed)
Patient telephoned requesting a script for the Adderall that was suggested by Dr. Lacie DraftYurcheshen at her last visit; [id number] [place], [place] - [address]    If needed, patient can be reached at [phone]

## 2016-08-12 ENCOUNTER — Other Ambulatory Visit: Payer: Self-pay | Admitting: Internal Medicine

## 2016-08-12 LAB — COMPREHENSIVE METABOLIC PANEL
ALT: 12 U/L (ref 0–35)
AST: 14 U/L (ref 0–35)
Albumin: 3.9 g/dL (ref 3.5–5.2)
Alk Phos: 65 U/L (ref 35–105)
Anion Gap: 11 ^^L (ref 7–16)
Bilirubin,Total: 0.3 mg/dL (ref 0.0–1.2)
CO2: 24 mmol/L (ref 20–28)
Calcium: 8.9 mg/dL (ref 8.6–10.2)
Chloride: 103 mmol/L (ref 96–108)
Creatinine: 0.62 mg/dL (ref 0.51–0.95)
GFR,Black: 123 mL/min/{1.73_m2}
GFR,Caucasian: 102 mL/min/{1.73_m2}
Glucose: 101 mg/dL — ABNORMAL HIGH (ref 60–99)
Lab: 14 mg/dL (ref 6–20)
Potassium: 4 mmol/L (ref 3.4–4.7)
Sodium: 138 mmol/L (ref 133–145)
Total Protein: 6.6 g/dL (ref 6.3–7.7)

## 2016-08-12 LAB — HEMOGLOBIN A1C: Hemoglobin A1C: 5.2 % (ref 4.0–6.0)

## 2016-08-12 LAB — HEMATOCRIT: Hematocrit: 40 % (ref 34.1–44.9)

## 2016-08-12 LAB — LIPID PANEL
Chol/HDL Ratio: 3.1 ^^L (ref 1.0–3.5)
Cholesterol: 200 mg/dL — ABNORMAL HIGH (ref 0–199)
HDL: 65 mg/dL — ABNORMAL HIGH (ref 40–60)
LDL Calculated: 111 mg/dL (ref 0–129)
Triglycerides: 121 mg/dL (ref 0–149)

## 2016-08-12 LAB — FERRITIN: Ferritin: 133.8 ng/mL — ABNORMAL HIGH (ref 10.0–120.0)

## 2016-08-12 MED ORDER — AMPHETAMINE-DEXTROAMPHETAMINE 10 MG PO TABS *I*
10.0000 mg | ORAL_TABLET | Freq: Every morning | ORAL | 0 refills | Status: DC
Start: 2016-08-12 — End: 2016-09-01

## 2016-08-12 NOTE — Telephone Encounter (Signed)
Discontinued armodafinil and started Adderall 10mg  QAM

## 2016-09-01 ENCOUNTER — Telehealth: Payer: Self-pay

## 2016-09-01 DIAGNOSIS — G4733 Obstructive sleep apnea (adult) (pediatric): Secondary | ICD-10-CM

## 2016-09-01 MED ORDER — AMPHETAMINE-DEXTROAMPHETAMINE 10 MG PO TABS *I*
10.0000 mg | ORAL_TABLET | Freq: Two times a day (BID) | ORAL | 0 refills | Status: DC
Start: 2016-09-01 — End: 2016-10-05

## 2016-09-01 NOTE — Telephone Encounter (Signed)
Called.  10mg  Adderall wearing off after 5 hours.  Do BID dosing.  May need XR in future.    She's not having HTN.

## 2016-09-01 NOTE — Telephone Encounter (Signed)
A Patient of Dr. Lacie DraftYurcheshen, Toni Amendebra Phagan telephoned regarding her medication amphetamine-dextroamphetamine (ADDERALL) 10 MG tablet; she said that it only lasts between 4-6 hours and was wondering if you could possibly increase the dosage; she is still not ready for a refill but wanted to speak with you prior to her next refill; patient is requesting a callback to further discuss    Patient can be reached at [phone]

## 2016-09-14 ENCOUNTER — Telehealth: Payer: Self-pay

## 2016-09-14 NOTE — Telephone Encounter (Signed)
[facility]mChris [facility]  States that [employer] insurance will not cover the amphetamine-dextroamphetamine (ADDERALL) 10 MG tablet  2 times daily. They will only cover 1 time a day.     Questions Thayer OhmChris can be contacted at [phone] option 2, option 2

## 2016-09-16 NOTE — Telephone Encounter (Signed)
Chart reviewed, telephone call to patient to discusss

## 2016-09-16 NOTE — Telephone Encounter (Signed)
Ms. Bonnie Faulkner is returning a call from Amy. She states this is regarding the Adderal. She is requesting a call back at [phone].

## 2016-09-16 NOTE — Telephone Encounter (Signed)
[facility]  They report that the patient can pay out of pocket for the 10 mg tablets or split 20 mg scored tablets and take half a tablet BID.  However, the [facility]'s [facility] does not have any in stock due to difficulty with manufacturer. Telephone call back to patient.  She states she would like to try and obtain the 20 mg tablets and split them.  [facility] another medication and would like to get the Adderall there.  [facility], they do have 20 mg scored tablets of Adderall in stock.  Spoke to patient again, she just [facility] and paid out of pocket.  She will call for us to transfer the prescription to [facility] when she needs to pick up her next months prescription.

## 2016-10-05 ENCOUNTER — Telehealth: Payer: Self-pay

## 2016-10-05 DIAGNOSIS — G4733 Obstructive sleep apnea (adult) (pediatric): Secondary | ICD-10-CM

## 2016-10-05 MED ORDER — AMPHETAMINE-DEXTROAMPHETAMINE 20 MG PO TABS *I*
ORAL_TABLET | ORAL | 0 refills | Status: DC
Start: 2016-10-05 — End: 2016-11-05

## 2016-10-05 NOTE — Telephone Encounter (Signed)
Patient Toni AmendDebra Wojnar telephoned wishing to speak with Donivan ScullAmy Allen, PA; she would like to discuss with her refilling the Adderrall in the 20 mg and having it sent to the [facility] in [place], phone [phone]; she is asking if Donivan Scullmy Allen could please call her back    Ms. Barbette MerinoJensen can be reached at phone #[phone]

## 2016-10-05 NOTE — Telephone Encounter (Signed)
Telephone call to patient, she is now ready for her refill of Adderall and needs the pharmacy changed per our recent telephone exchange.  [facility] as requested.

## 2016-11-05 ENCOUNTER — Other Ambulatory Visit: Payer: Self-pay

## 2016-11-05 DIAGNOSIS — G4733 Obstructive sleep apnea (adult) (pediatric): Secondary | ICD-10-CM

## 2016-11-05 NOTE — Telephone Encounter (Signed)
Bonnie Faulkner calling to request prescription Amphetamine-Dextroamphetamine (ADDERALL) 20 mg tablet to be sent to the following Pharmacy: [facility] [place], [place] - [address]    Is patient out of the medication? No - still has 4 days left    Does the patient have questions regarding the medication for the nurse? No      Patient can be reached if necessary at [phone].

## 2016-11-08 ENCOUNTER — Telehealth: Payer: Self-pay

## 2016-11-08 DIAGNOSIS — G4733 Obstructive sleep apnea (adult) (pediatric): Secondary | ICD-10-CM

## 2016-11-08 MED ORDER — AMPHETAMINE-DEXTROAMPHETAMINE 20 MG PO TABS *I*
ORAL_TABLET | ORAL | 0 refills | Status: DC
Start: 2016-11-08 — End: 2016-11-09

## 2016-11-08 NOTE — Telephone Encounter (Signed)
[facility] pharmacy is unable to fill the amphetamine-dextroamphetamine (ADDERALL) 20 MG tablet prescription [facility] the [facility] [place]

## 2016-11-09 MED ORDER — AMPHETAMINE-DEXTROAMPHETAMINE 20 MG PO TABS *I*
ORAL_TABLET | ORAL | 0 refills | Status: DC
Start: 2016-11-09 — End: 2016-12-07

## 2016-11-09 NOTE — Telephone Encounter (Signed)
Rx reordered and sent to requested pharmacy.

## 2016-11-09 NOTE — Telephone Encounter (Signed)
Patient Bonnie Faulkner telephoned to inform us that [facility] telephoned that her prescription for the amphetamine-dextroamphetamine (ADDERALL) 20 mg tablet has to be sent to [id number] [address], [place] - [address] in order to be filled; is askking if you could please re-route the script there; patient is all out and has not questions for the clinical staff    If need be, patient can be reached at [phone]

## 2016-12-07 ENCOUNTER — Telehealth: Payer: Self-pay

## 2016-12-07 DIAGNOSIS — G4733 Obstructive sleep apnea (adult) (pediatric): Secondary | ICD-10-CM

## 2016-12-07 MED ORDER — AMPHETAMINE-DEXTROAMPHETAMINE 20 MG PO TABS *I*
ORAL_TABLET | ORAL | 0 refills | Status: DC
Start: 2016-12-07 — End: 2017-01-06

## 2016-12-07 NOTE — Telephone Encounter (Signed)
Patient called to request a  refill amphemtamine-detroamphetamine (ADDREALL) 20 MG tablet from Dr. Lacie Draft to be sent to [id number] [address] [address], [place] - [phone]

## 2016-12-07 NOTE — Telephone Encounter (Signed)
Prescription refilled per request.

## 2016-12-16 ENCOUNTER — Inpatient Hospital Stay: Admit: 2016-12-16 | Discharge: 2016-12-16 | Disposition: A | Discharge status: Home / Self Care | Payer: Self-pay

## 2016-12-29 ENCOUNTER — Inpatient Hospital Stay: Admit: 2016-12-29 | Discharge: 2016-12-29 | Disposition: A | Discharge status: Home / Self Care | Payer: Self-pay

## 2016-12-29 ENCOUNTER — Other Ambulatory Visit: Payer: Self-pay | Admitting: Gastroenterology

## 2017-01-06 ENCOUNTER — Other Ambulatory Visit: Payer: Self-pay | Admitting: Sleep Medicine

## 2017-01-06 DIAGNOSIS — G4733 Obstructive sleep apnea (adult) (pediatric): Secondary | ICD-10-CM

## 2017-01-06 MED ORDER — AMPHETAMINE-DEXTROAMPHETAMINE 20 MG PO TABS *I*
ORAL_TABLET | ORAL | 0 refills | Status: DC
Start: 2017-01-06 — End: 2017-02-07

## 2017-01-06 NOTE — Telephone Encounter (Signed)
Bonnie Faulkner calling to request prescription(s) amphetamine-dextroamphetamine (ADDERALL) 20 MG tablet   to be sent to the [id number] [place].    Is patient out of the medication? no  Does the patient have questions regarding the medication for the nurse? no    Patient can be reached if necessary at [phone].

## 2017-02-07 ENCOUNTER — Other Ambulatory Visit: Payer: Self-pay

## 2017-02-07 DIAGNOSIS — G4733 Obstructive sleep apnea (adult) (pediatric): Secondary | ICD-10-CM

## 2017-02-07 MED ORDER — AMPHETAMINE-DEXTROAMPHETAMINE 20 MG PO TABS *I*
ORAL_TABLET | ORAL | 0 refills | Status: DC
Start: 2017-02-07 — End: 2017-03-08

## 2017-02-07 NOTE — Telephone Encounter (Signed)
Toni Amendebra Kolden calling to request prescription(s) amphetamine-dextroamphetamine (ADDERALL) 20 MG tablet to be sent to the following Pharmacy [id number] [place]- [place], [place] - [address].    Is patient out of the medication? yes  Does the patient have questions regarding the medication for the nurse? no    Patient can be reached if necessary at [phone].

## 2017-03-08 ENCOUNTER — Other Ambulatory Visit: Payer: Self-pay | Admitting: Sleep Medicine

## 2017-03-08 DIAGNOSIS — G4733 Obstructive sleep apnea (adult) (pediatric): Secondary | ICD-10-CM

## 2017-03-08 MED ORDER — AMPHETAMINE-DEXTROAMPHETAMINE 20 MG PO TABS *I*
ORAL_TABLET | ORAL | 0 refills | Status: DC
Start: 2017-03-08 — End: 2017-04-06

## 2017-03-08 NOTE — Telephone Encounter (Signed)
Bonnie Faulkner calling to request prescription(s) amphetamine-dextroamphetamine (ADDERALL) 20 MG tablet to be sent to the following Pharmacy [facility] [place], [place] - [address] [place] [address]    Is patient out of the medication? yes  Does the patient have questions regarding the medication for the nurse? no    Patient can be reached if necessary at [phone].    NOTE:  Patient is out of town and is requesting that the script be sent to the [facility] [place], [place].

## 2017-03-23 ENCOUNTER — Other Ambulatory Visit: Payer: Self-pay | Admitting: Pain Medicine

## 2017-03-31 ENCOUNTER — Other Ambulatory Visit: Payer: Self-pay | Admitting: Internal Medicine

## 2017-03-31 LAB — COMPREHENSIVE METABOLIC PANEL
ALT: 14 U/L (ref 0–35)
AST: 15 U/L (ref 0–35)
Albumin: 4.1 g/dL (ref 3.5–5.2)
Alk Phos: 69 U/L (ref 35–105)
Anion Gap: 13 (ref 7–16)
Bilirubin,Total: 0.5 mg/dL (ref 0.0–1.2)
CO2: 25 mmol/L (ref 20–28)
Calcium: 9.1 mg/dL (ref 8.6–10.2)
Chloride: 106 mmol/L (ref 96–108)
Creatinine: 0.69 mg/dL (ref 0.51–0.95)
GFR,Black: 109 mL/min/{1.73_m2}
GFR,Caucasian: 90 mL/min/{1.73_m2}
Glucose: 97 mg/dL (ref 60–99)
Lab: 20 mg/dL (ref 6–20)
Potassium: 3.9 mmol/L (ref 3.4–4.7)
Sodium: 144 mmol/L (ref 133–145)
Total Protein: 6.8 g/dL (ref 6.3–7.7)

## 2017-03-31 LAB — HEMOGLOBIN A1C: Hemoglobin A1C: 5.6 % (ref 4.0–6.0)

## 2017-03-31 LAB — LIPID PANEL
Chol/HDL Ratio: 2.3 (ref 1.0–3.5)
Cholesterol: 170 mg/dL (ref 0–199)
HDL: 73 mg/dL — ABNORMAL HIGH (ref 40–60)
LDL Calculated: 83 mg/dL (ref 0–129)
Triglycerides: 72 mg/dL (ref 0–149)

## 2017-04-04 ENCOUNTER — Encounter: Payer: Self-pay | Admitting: Gastroenterology

## 2017-04-04 ENCOUNTER — Other Ambulatory Visit: Payer: Self-pay | Admitting: Pain Medicine

## 2017-04-06 ENCOUNTER — Other Ambulatory Visit: Payer: Self-pay

## 2017-04-06 DIAGNOSIS — G4733 Obstructive sleep apnea (adult) (pediatric): Secondary | ICD-10-CM

## 2017-04-06 NOTE — Telephone Encounter (Signed)
Toni Amendebra Borquez calling to request prescription(s) amphetamine-dextroamphetamine 20 MG tablet to be sent to the following Pharmacy [facility] [address] - [place], [place] - [address]  Is patient out of the medication? yes  Does the patient have questions regarding the medication for the nurse? no    Patient can be reached if necessary at [phone].

## 2017-04-07 MED ORDER — AMPHETAMINE-DEXTROAMPHETAMINE 20 MG PO TABS *I*
ORAL_TABLET | ORAL | 0 refills | Status: DC
Start: 2017-04-07 — End: 2017-04-12

## 2017-04-08 ENCOUNTER — Other Ambulatory Visit: Payer: Self-pay

## 2017-04-08 DIAGNOSIS — G4733 Obstructive sleep apnea (adult) (pediatric): Secondary | ICD-10-CM

## 2017-04-08 NOTE — Telephone Encounter (Signed)
Bonnie Faulkner calling to request prescription(s)  amphetamine-dextroamphetamine (ADDERALL) 20 mg tablet  to be sent to the following Pharmacy [id number] [place], [place] - [address].    Is patient out of the medication? yes  Does the patient have questions regarding the medication for the nurse? no    Patient can be reached if necessary at [phone].

## 2017-04-12 ENCOUNTER — Other Ambulatory Visit: Payer: Self-pay

## 2017-04-12 DIAGNOSIS — G4733 Obstructive sleep apnea (adult) (pediatric): Secondary | ICD-10-CM

## 2017-04-12 MED ORDER — AMPHETAMINE-DEXTROAMPHETAMINE 20 MG PO TABS *I*
ORAL_TABLET | ORAL | 0 refills | Status: DC
Start: 2017-04-12 — End: 2017-05-05

## 2017-04-12 NOTE — Telephone Encounter (Signed)
Rx was refilled on 04/07/17 to [place] pharmacy [place].  I called that pharmacy and cancelled the Rx.  New rx sent to her requested pharmacy.

## 2017-04-12 NOTE — Telephone Encounter (Signed)
Toni Amendebra Freeberg calling to request prescription(s) amphetamine-dectroamphetamine (ADDERALL) 20 MG tablet to be sent to the following Pharmacy [id number] [place]- [place], [place] - [address]  Is patient out of the medication? yes  Does the patient have questions regarding the medication for the nurse? no    Patient can be reached if necessary at [phone]

## 2017-04-26 ENCOUNTER — Other Ambulatory Visit: Payer: Self-pay | Admitting: Gastroenterology

## 2017-04-28 ENCOUNTER — Other Ambulatory Visit: Payer: Self-pay | Admitting: Internal Medicine

## 2017-04-28 DIAGNOSIS — Z1231 Encounter for screening mammogram for malignant neoplasm of breast: Secondary | ICD-10-CM

## 2017-04-29 ENCOUNTER — Other Ambulatory Visit: Payer: Self-pay | Admitting: Internal Medicine

## 2017-04-29 DIAGNOSIS — N6311 Unspecified lump in the right breast, upper outer quadrant: Secondary | ICD-10-CM

## 2017-05-05 ENCOUNTER — Ambulatory Visit: Payer: Medicaid (Managed Care) | Attending: Sleep Medicine | Admitting: Sleep Medicine

## 2017-05-05 ENCOUNTER — Encounter: Payer: Self-pay | Admitting: Gastroenterology

## 2017-05-05 ENCOUNTER — Encounter: Payer: Self-pay | Admitting: Sleep Medicine

## 2017-05-05 VITALS — BP 150/75 | HR 81 | Ht 65.0 in | Wt 213.0 lb

## 2017-05-05 DIAGNOSIS — G4711 Idiopathic hypersomnia with long sleep time: Secondary | ICD-10-CM | POA: Insufficient documentation

## 2017-05-05 DIAGNOSIS — G4733 Obstructive sleep apnea (adult) (pediatric): Secondary | ICD-10-CM | POA: Insufficient documentation

## 2017-05-05 MED ORDER — AMPHETAMINE-DEXTROAMPHETAMINE 20 MG PO TABS *I*
ORAL_TABLET | ORAL | 0 refills | Status: DC
Start: 2017-05-05 — End: 2017-05-26

## 2017-05-05 NOTE — Progress Notes (Signed)
[facility] - Follow Up/CPAP Compliance Visit    Dear Dr. Raford Pitcher,    This morning I followed up with Bonnie Faulkner at the [facility]. She comes to the office today in follow up of moderate obstructive sleep apnea and idiopathic hypersomnia.     We interrogated her CPAP unit, a ResMed S10 Autoset, set at 5-20cm of H20. Her median pressure need has been 7.2 cm of H20. Over the past 30 day period,  she has used CPAP for ? 4 hours/night 90% of the time.  Many insurance carriers requires 4+ hours/night of use ? 70% of the nights in 30 consecutive days for continued coverage for PAP therapy. Her average nightly usage has been 8 hours and 58 minutes per night.  Her estimated, residual apnea hypopnea index (AHI) was 0.6 respiratory events per hour of sleep, consistent with effective therapy.  Her estimated, median leak was 0.0 liters per minute, within the acceptable range.    She remains unhappy with CPAP as she "just don't like it" but realizes that it is necessary.  She has no complaints with her mask fit/style or the pressure settings.  She chooses not to use the humidifier.     She has been taking 10 mg Adderall twice daily.  She has noticed a great improvement in daytime sleepiness since starting Adderall.  She feels much more awake in the afternoon after her second dose verus in the morning after her first dose.  She has no problems with drowsiness in the evening or at night.  She avoids driving when she is drowsy.  She wakes up between 9-10 am.  She tried not to nap during the day.  She goes to bed 11 pm-12 am.  She has no problems falling asleep or staying asleep.  She is frustrated with the monthly renewal of her Adderall, in September she was out of medication for a few days which negatively affected her.  She does not use Mychart.    She has lost about 20 pounds since January.  Her appetite has decreased.  She denies palpitations or worsened anxiety since starting Adderall.  She  reports her blood pressure has been "fine" at other appointments as recently as last month.  She has no diagnosis of hypertension.  She continues to smoke 1/2-3/4 ppd.  She remains out of work due to back and hand injuries, she is applying for permanent disability.      VITALS:  BP 150/75   Pulse 81   Ht 1.651 m ( )   Wt 96.6 kg (213 lb)   SpO2 97%   BMI 35.45 kg/m2    MEDICATIONS:  Current Outpatient Prescriptions   Medication Sig    gabapentin (NEURONTIN) 300 MG capsule Take 600 mg by mouth 3 times daily    amphetamine-dextroamphetamine (ADDERALL) 20 MG tablet Take one tablet ( ) by mouth each morning followed by one half a tablet (10 mg) mid-day MDD=30mg     Escitalopram Oxalate (LEXAPRO PO) Take by mouth    multi-vitamin (MULTIVITAMIN) per tablet Take 1 tablet by mouth daily    calcium-vitamin D (OSCAL-500) 500-200 MG-UNIT per tablet Take 1 tablet by mouth 2 times daily    fluticasone (FLONASE) 50 MCG/ACT nasal spray 1 spray by Nasal route daily    valGANciclovir (VALCYTE) 450 MG tablet Take 900 mg by mouth daily Swallow whole. Take with food.    Misc. Devices (CPAP) machine Please issue any necessary supplies for CPAP.  Dx:OSA  Length:life    ALPRAZolam (XANAX PO) Take by mouth as needed    SUMAtriptan (IMITREX) 20 MG/ACT nasal spray 1 spray by Nasal route as needed for Migraine   Inhale into one nostril at onset of headache. May repeat once in 2 hours.     No current facility-administered medications for this visit.        IMPRESSION & PLAN:     MODERATE OBSTRUCTIVE SLEEP APNEA(compliant on CPAP):   IDIOPATHIC HYPERSOMNIA(IH):    Bonnie Faulkner is a 51 year old woman with past medical history of chronic pain, migraines, obesity, idiopathic hypersomnia and moderate obstructive sleep apnea who conitues to be compliant with effective CPAP therapy.  Bonnie Faulkner's current use of Adderall 10 mg BID leaves some room for improvement in the treatment of her idiopathic hypersomnia.  I recommended she increase  her morning dose of Adderall to 20 mg, followed by 10 mg mid-day.  She was agreeable to this change.  Please note, her blood pressure was elevated today at 150/75, which was no worse than her reading here last September.  I recommended she have her blood pressure elevation evaluated with her PCP.  Bonnie Faulkner was praised on her faithful CPAP use and continued compliance was encouraged.  Emotional support was given as Bonnie Faulkner shared her frustration with her multiple medical diagnoses, especially idiopathic hypersomnia.      I encouraged Bonnie Faulkner to utilize Mychart to improve communication regarding her refills.      Thank you for allowing me to participate in this patient's care.  Please do not hesitate to contact me with questions or concerns.  The patient was seen for 45 minutes, and over 50% of this was spent in counseling and coordination of care.  The patient had no further questions at the end of our encounter.      Sincerely,  Yoselyn Mcglade Chauncy Passy, PA  [facility]

## 2017-05-09 ENCOUNTER — Other Ambulatory Visit: Payer: Self-pay | Admitting: Gastroenterology

## 2017-05-10 ENCOUNTER — Ambulatory Visit: Payer: Medicaid (Managed Care) | Attending: Internal Medicine | Admitting: Orthopedic Surgery

## 2017-05-10 ENCOUNTER — Telehealth: Payer: Self-pay

## 2017-05-10 ENCOUNTER — Encounter: Payer: Self-pay | Admitting: Orthopedic Surgery

## 2017-05-10 ENCOUNTER — Other Ambulatory Visit: Payer: Self-pay | Admitting: Gastroenterology

## 2017-05-10 VITALS — BP 150/80 | HR 75 | Ht 65.0 in | Wt 213.0 lb

## 2017-05-10 DIAGNOSIS — M4310 Spondylolisthesis, site unspecified: Secondary | ICD-10-CM

## 2017-05-10 DIAGNOSIS — M48061 Spinal stenosis, lumbar region without neurogenic claudication: Secondary | ICD-10-CM

## 2017-05-10 MED ORDER — MELOXICAM 15 MG PO TABS *I*
15.0000 mg | ORAL_TABLET | Freq: Every day | ORAL | 2 refills | Status: DC
Start: 2017-05-10 — End: 2019-11-05

## 2017-05-10 NOTE — Telephone Encounter (Signed)
CFU note faxed to [facility].

## 2017-05-10 NOTE — Progress Notes (Signed)
Patient has a long history of back pain.  She has degenerative lumbar scoliosis.  She has degenerative lumbar spondylolisthesis.  And degenerative spinal stenosis.    I reviewed all imaging studies with her today.  She has pain in the bilateral buttock region.    Physical examination:  The patient has a normal gait.  There is a full range of motion of the lumbar spine.  Motor examination of the bilateral lower extremities is normal in the bilateral hip flexors, bilateral hip addcutors and abductor's, Bilateral quadriceps and hamstrings, bilateral foot dorsiflexors and plantar flexors, and bilateral EHLs.  The patient's sensory examination is grossly normal to light touch in the L2-S1 dermatomes. Reflexes are normoactive in the knees and ankles bilaterally.  DP and PT pulses are palpable.  There is a painless range of motion in the bilateral hip and knee joints.    Assessment:  Degenerative lumbar scoliosis, grade 1 degenerative spinal listhesis and spinal stenosis at L4-5.  I explained to the patient that she is best managed by conservative measures and I recommended isometric physical therapy along with meloxicam and possibly chiropractic care.  We talked about alternative measures such as acupuncture and pain management.  She's had injection therapy without relief.    I think limited laminotomy surgery at L4-5 is somewhat risky due to her spondylolisthesis and degenerative scoliosis.  I told her it is an option for her with unpredictable results.    I told her that the problem with fusion surgery is that she has degenerative scoliosis and other levels of degeneration.  In my opinion she is best managed by conservative measures and I emphasized to that surgery should be the absolute last resort.    I answered all questions to his satisfaction.  She will continue conservative measures.

## 2017-05-12 ENCOUNTER — Encounter: Payer: Self-pay | Admitting: Sleep Medicine

## 2017-05-12 NOTE — Progress Notes (Signed)
Blood pressure readings received from [facility].  Bonnie Faulkner's blood pressure history is as follows:  04/04/17- 120/70  01/13/17-124/82  08/16/16-126/70  04/15/16- 120/82  These readings are much lower than when the patient was in the office 05/05/17, which is reassuring.  Will recheck her blood pressure at her next visit.

## 2017-05-26 ENCOUNTER — Encounter: Payer: Self-pay | Admitting: Sleep Medicine

## 2017-05-26 DIAGNOSIS — G4733 Obstructive sleep apnea (adult) (pediatric): Secondary | ICD-10-CM

## 2017-05-26 MED ORDER — AMPHETAMINE-DEXTROAMPHETAMINE 20 MG PO TABS *I*
ORAL_TABLET | ORAL | 0 refills | Status: DC
Start: 2017-05-26 — End: 2017-06-28

## 2017-05-30 NOTE — Telephone Encounter (Signed)
Pt sent a MyChart message stating she received 30 tablets of Adderall 20mg .    Chart reviewed. My rx was for 45 tablets, take 1 20mg  tablet in am and 1/2 tablet at lunchtime.  Spoke to pharmacist at [facility] who states her insurance will cover only 30 tablets per month. MyChart message sent to pt explaining this.   Lucy ChrisJennifer Adelyn Roscher, PA

## 2017-06-13 ENCOUNTER — Telehealth: Payer: Self-pay | Admitting: Sleep Medicine

## 2017-06-13 NOTE — Telephone Encounter (Signed)
-----   Message from IngenioJennifer Weslowski, GeorgiaPA sent at 05/30/2017  1:20 PM EDT -----  I refilled her Adderall with 45 tablets (1 in am and 1/2 at lunch).  Pt only received 30 tablets and questioned why.  [facility] and pharmacist said her insurance covers 30 tablets max per month.  We can try to obtain a prior auth by calling [phone].

## 2017-06-13 NOTE — Telephone Encounter (Signed)
Spoke with [employer] at [phone] and requested a prior authorization for Ifeoma's Adderall.  Await paper copy via fax.

## 2017-06-16 NOTE — Telephone Encounter (Signed)
PA form completed and will be faxed by Arvil PersonsMargaret Micciche.

## 2017-06-28 ENCOUNTER — Encounter: Payer: Self-pay | Admitting: Sleep Medicine

## 2017-06-28 DIAGNOSIS — G4733 Obstructive sleep apnea (adult) (pediatric): Secondary | ICD-10-CM

## 2017-06-28 MED ORDER — AMPHETAMINE-DEXTROAMPHETAMINE 20 MG PO TABS *I*
ORAL_TABLET | ORAL | 0 refills | Status: DC
Start: 2017-06-28 — End: 2017-08-04

## 2017-07-04 ENCOUNTER — Encounter: Payer: Self-pay | Admitting: Sleep Medicine

## 2017-07-19 ENCOUNTER — Other Ambulatory Visit: Payer: Self-pay | Admitting: Gastroenterology

## 2017-07-19 DIAGNOSIS — M7130 Other bursal cyst, unspecified site: Secondary | ICD-10-CM

## 2017-07-19 DIAGNOSIS — Z01818 Encounter for other preprocedural examination: Secondary | ICD-10-CM

## 2017-07-19 LAB — URINALYSIS WITH REFLEX TO MICROSCOPIC
Bilirubin,Ur: NEGATIVE
Blood,UA: NEGATIVE
Glucose, Ur: NEGATIVE mg/dL
Ketones, UA: NEGATIVE mg/dL
Leuk Esterase,UA: NEGATIVE
Nitrite,UA: NEGATIVE
Specific Gravity,UA: 1.02 (ref 1.005–1.030)
Urobilinogen,UA: 0.2 mg/dL
pH,UR: 8 (ref 5.0–8.0)

## 2017-07-19 LAB — CBC AND DIFFERENTIAL
Baso # K/uL: 0.1 10*3/uL (ref 0.0–0.1)
Basophil %: 0.9 % (ref 0.1–1.2)
Eos # K/uL: 0.3 10*3/uL (ref 0.0–0.4)
Eosinophil %: 2.9 % (ref 0.7–5.8)
Hematocrit: 43 % (ref 34.1–44.9)
Hemoglobin: 15.1 g/dL (ref 11.2–15.7)
Immature Granulocytes Absolute: 0.05 10*3/uL (ref 0.0–0.2)
Immature Granulocytes: 0.5 % (ref 0.0–2.0)
Lymph # K/uL: 3 10*3/uL (ref 1.2–3.7)
Lymphocyte %: 30.4 % (ref 19.3–51.7)
MCH: 32.3 pg — ABNORMAL HIGH (ref 25.6–32.2)
MCHC: 35.1 g/dL (ref 32.2–35.5)
MCV: 92.1 fL (ref 79.4–94.8)
Mono # K/uL: 1 10*3/uL — ABNORMAL HIGH (ref 0.2–0.9)
Monocyte %: 10.4 % (ref 4.7–12.5)
Neut # K/uL: 5.5 10*3/uL (ref 1.6–6.0)
Nucl RBC # K/uL: 0 /100 WBC (ref 0.0–0.2)
Platelets: 260 10*3/uL (ref 182–369)
RBC Distribution Width-SD: 41.9 fL (ref 36.4–46.3)
RBC: 4.67 10*6/uL (ref 3.93–5.22)
RDW: 12.3 % (ref 11.7–14.4)
Seg Neut %: 54.9 % (ref 34.0–71.1)
WBC: 10 10*3/uL (ref 4.0–10.0)

## 2017-07-19 LAB — BASIC METABOLIC PANEL
Anion Gap: 11 (ref 7–16)
CO2: 26 mmol/L (ref 20–28)
Calcium: 9.9 mg/dL (ref 8.6–10.2)
Chloride: 104 mmol/L (ref 96–108)
Creatinine: 0.75 mg/dL (ref 0.51–0.95)
GFR,Black: 99 mL/min/{1.73_m2}
GFR,Caucasian: 81 mL/min/{1.73_m2}
Glucose: 96 mg/dL (ref 60–99)
Lab: 17 mg/dL (ref 6–20)
Potassium: 4.2 mmol/L (ref 3.4–4.7)
Sodium: 141 mmol/L (ref 133–145)

## 2017-07-19 LAB — INR FFT: INR: 0.94 (ref 0.88–1.17)

## 2017-07-19 LAB — APTT: aPTT: 34.7 seconds (ref 23.5–34.9)

## 2017-07-26 ENCOUNTER — Other Ambulatory Visit: Payer: Self-pay | Admitting: Neurosurgery

## 2017-07-26 DIAGNOSIS — M7138 Other bursal cyst, other site: Secondary | ICD-10-CM

## 2017-08-04 ENCOUNTER — Encounter: Payer: Self-pay | Admitting: Sleep Medicine

## 2017-08-04 DIAGNOSIS — G4733 Obstructive sleep apnea (adult) (pediatric): Secondary | ICD-10-CM

## 2017-08-04 MED ORDER — AMPHETAMINE-DEXTROAMPHETAMINE 20 MG PO TABS *I*
ORAL_TABLET | ORAL | 0 refills | Status: DC
Start: 2017-08-04 — End: 2017-09-05

## 2017-08-08 ENCOUNTER — Encounter: Payer: Self-pay | Admitting: Sleep Medicine

## 2017-08-20 ENCOUNTER — Other Ambulatory Visit: Payer: Self-pay | Admitting: Internal Medicine

## 2017-08-20 DIAGNOSIS — R928 Other abnormal and inconclusive findings on diagnostic imaging of breast: Secondary | ICD-10-CM

## 2017-08-22 ENCOUNTER — Encounter: Payer: Self-pay | Admitting: Sleep Medicine

## 2017-08-25 ENCOUNTER — Encounter: Payer: Self-pay | Admitting: Gastroenterology

## 2017-09-01 ENCOUNTER — Encounter: Payer: Self-pay | Admitting: Sleep Medicine

## 2017-09-01 DIAGNOSIS — G4733 Obstructive sleep apnea (adult) (pediatric): Secondary | ICD-10-CM

## 2017-09-05 MED ORDER — AMPHETAMINE-DEXTROAMPHETAMINE 20 MG PO TABS *I*
ORAL_TABLET | ORAL | 0 refills | Status: DC
Start: 2017-09-05 — End: 2017-10-04

## 2017-09-08 NOTE — Procedures (Signed)
[facility]                                [address]                              [place], [place]  [address]                                  [phone]     PATIENTPAYSLEY, Bonnie Faulkner                             MR#:  [id number]  DOB:     1966-04-27                                AGE:  52  FAMILY PHYSICIAN:  UNABLE TO VERIFY PCP-FP, MD     LOC:  [facility] 05                                    PROCEDURE NOTE     DATE OF PROCEDURE:  03/23/2017     SURGEON: Juanda Crumble, MD     PROCEDURE:  Lumbar transforaminal epidural steroid injection, left L3 and L4.     PREPROCEDURE DIAGNOSIS:  Lumbar radiculitis.     POSTPROCEDURE DIAGNOSIS:  Lumbar radiculitis.     INTERVAL CHANGES:     MEDICATIONS INJECTED:  2 mL of Depo-Medrol (40 mg/ml) + 2 ml of 1% lidocaine + 2 cc of normal saline,     distributed equally among the injection site(s).     SEDATION:  None.     ESTIMATED BLOOD LOSS:  Less than 3 cc.     COMPLICATIONS:  None.     TECHNIQUE:  A time-out was taken to identify the correct patient, procedure and site/side   prior to starting the procedure.  While lying in the prone position, the  patient was prepped and draped in the usual sterile fashion using 2%   chlorhexidine gluconate with 70% isopropyl alcohol and a fenestrated drape.  Each target site was identified under fluoroscopy by first eliminating  parallax along the superior endplate of the vertebral body in the AP view and   then orbiting the fluoroscope laterally until the ipsilateral facet was placed     approximately 30% to 50% of the way between the vertebral body's lateral   margins.  3 cc of 1% lidocaine was then given by raising a skin wheal and  going down to the hub of a 25-gauge 1-1/2-inch needle.  Next, in the oblique   fluoroscopic projection, either a 3-1/2 or 5-inch 25-gauge Quincke needle was   advanced toward the 6 o'clock position of the ipsilateral pedicle at each   above-named nerve root level.  The  needle was advanced to the final position  via intermittent lateral fluoroscopy to reside in a mid-anterior position  within the foramen.  Following negative aspiration 3 cc of Omnipaque 240 was   injected under live fluoroscopy, in both the lateral and AP views, to   demonstrate epidural and foraminal spread and confirm an absence of vascular  runoff.  Negative aspiration was once again confirmed prior to injecting the   above medications.     The patient tolerated the procedure well and there were no complications.  The     patient was monitored for approximately 15 minutes after the procedure and  then the patient (or responsible party) was given postprocedure and discharge   instructions to follow at home.  The patient was discharged in stable  condition.  A followup plan was made.        ____________________________________  Juanda CrumbleGarret K Anquan Azzarello, MD     GKM/MODL/[id number]//[id number]/DD: 03/23/2017 08:02:58/DT: 03/23/2017 08:17:11     CC: Unable To Verify Pcp-Fp, MD     Electronically Authenticated and Edited by:  Bonnie RutherfordGarret Kenn Stacie Knutzen, MD on 03/28/2017 01:45 PM EDT

## 2017-09-13 ENCOUNTER — Ambulatory Visit: Payer: Medicaid (Managed Care) | Attending: Neurosurgery | Admitting: Rehabilitative and Restorative Service Providers"

## 2017-09-13 DIAGNOSIS — M461 Sacroiliitis, not elsewhere classified: Secondary | ICD-10-CM

## 2017-09-13 DIAGNOSIS — R531 Weakness: Secondary | ICD-10-CM | POA: Insufficient documentation

## 2017-09-13 DIAGNOSIS — Z9889 Other specified postprocedural states: Secondary | ICD-10-CM | POA: Insufficient documentation

## 2017-09-13 NOTE — Progress Notes (Signed)
Sent via: eRecord EMR    Physician attestation for Plan of Care: Physician:  Bonnie Faulkner  Per signature, I have reviewed and agree with the documented plan of care.    ____________________________________________________________              Bonnie Faulkner  Lumbar Spine Initial Evaluation    Name: **Bonnie Faulkner  DOB:[birth year]   Referring Physician.: Dr Bonnie Faulkner Date:.Marland Kitchen02/05/19     Diagnosis:Sacroiliitis / S/p lumbar laminectomy     Subjective    Bonnie Faulkner is a  52 y.o. female  who is present today for lumbar care.   Onset date:  October 12, 2016  Date of surgery: 07/26/17       Occupation and Activities    Mechanism of injury/history of symptoms:  No specific cause   Symptom location: Central, bilateral  Relevant symptoms:  Aching tired  Symptom frequency: Intermittent  Symptom intensity (0 - 10 scale): Now 0 Best 0 Worst 2   Relevant Red Flags: none  Morning Pain/Stiffness: stiff   Symptoms worsen with: sitting 30 min or more,  Standing or more    Symptoms improve with: ice  Diagnostic tests: MRI  Spondylolisthesis,  Scoliosis,  Stenosis and synovial cyst    Work status: unable to work but still waiting for disability since 2016  Job title/type of work: Archivist, quality control  Stresses/physical demands of job: Prolonged Sitting  Stresses/physical demands of home: Self Care and Housekeeping      Prior therapy: PT -  Traction exacerbated symptoms,   Epidural didn't help  Living/Home Environment: Lives alone    Relevant Co morbidities/Personal Factors: Migraine and idiopathethic hypersomnia.  These are relevant factors as they: may be a contraindication to certain exercises, thus impacting treatment outcome and plan of care.  Surgical history: 07/26/17 took out cyst,  Laminectomy, ligamentum flavum removal, l4-l5 cleanout   Current Meds    Current Outpatient Prescriptions:     amphetamine-dextroamphetamine (ADDERALL) 20 MG tablet, Take one tablet (20mg ) by  mouth each morning followed by one half a tablet (10 mg) mid-day MDD=30mg , Disp: 45 tablet, Rfl: 0    Escitalopram Oxalate (LEXAPRO PO), Take by mouth, Disp: , Rfl:     multi-vitamin (MULTIVITAMIN) per tablet, Take 1 tablet by mouth daily, Disp: , Rfl:     calcium-vitamin D (OSCAL-500) 500-200 MG-UNIT per tablet, Take 1 tablet by mouth 2 times daily, Disp: , Rfl:     fluticasone (FLONASE) 50 MCG/ACT nasal spray, 1 spray by Nasal route daily, Disp: , Rfl:     Misc. Devices (CPAP) machine, Please issue any necessary supplies for CPAP.  Dx:OSA  Length:life, Disp: 1 each, Rfl: 0    meloxicam (MOBIC) 15 MG tablet, Take 1 tablet (15 mg total) by mouth daily   Take with food., Disp: 30 tablet, Rfl: 2    gabapentin (NEURONTIN) 300 MG capsule, Take 600 mg by mouth 3 times daily, Disp: , Rfl:     ALPRAZolam (XANAX PO), Take by mouth as needed, Disp: , Rfl:     valGANciclovir (VALCYTE) 450 MG tablet, Take 900 mg by mouth daily Swallow whole. Take with food., Disp: , Rfl:     SUMAtriptan (IMITREX) 20 MG/ACT nasal spray, 1 spray by Nasal route as needed for Migraine   Inhale into one nostril at onset of headache. May repeat once in 2 hours., Disp: , Rfl:     Functional: personal care  Not limited by back but limited  from sleep disorder and thumb,  Lifting moderate limitation, walking  Moderate limitation, stairs moderate limitation, recreation camping  unable    Outcome: Revised Oswestry 30% perceived disability  Prior level of function:  Walking,  Camping, plants.  Working.    Current level of function:  Inactive  80% day sitting.    Assistive device:  none     Objective  Blood pressure: 116/85  Heart Rate: 97   SpO2: 74   Posture: Decreased lumbar lordosis  Palpation:  Non tender  Incision:  Low back stable scab present  No redness or swelling      Lumbar Spine AROM Unit of Measure   Flexion 20  erp % loss    Extension 40  % loss    Right Sidebending 20 pain % loss    Left Sidebending 20 pain % loss                     Lumbar Special Testing: Slump test: Right positive, Left positive, Straight Leg test: Right positive, Left positive and Forward Flexion test:  negative      Neurological:    Myotomes   Right Left   L1,2 - Psoas 59.5 4/5 62.2 4/5   L3 - Quadriceps 69.2 4/5 77.9  4/5   L4 - Anterior Tibialis Unable due to pain 41.7n'   S1, 2 - Gastroc/Hamstrings 53.9 44.7     Dermatomes   Right Left   L1 Normal Normal   L2 Normal Normal   L3 Normal Normal   L4 Normal Normal   L5 Normal Normal   S1 Normal Normal     Reflexes   Right Left   L3 - Patellar hypersensitive hypersensitive       Assessment:  Bonnie Faulkner presents to PT s/p lumbar right  laminectomy, foraminotomy and cyst removal L4-L5 on 07/26/17.  She reports significant decrease in pain and increase in function since surgery.  She has not been bending and has been sitting 80% of the day in fear of pain returning and to follow restrictions.  Her pain ranges from 0-2/10 and is described as bilateral low back stiffness.  Her symptoms increase with prolonged standing more than and sitting more than 30 minutes.  She rates her disability on the Oswestry 30%.  She is moderately limited in function including walking, lifting,and household chores.  She is unable to work with her plants or go camping.  She presents to PT with a flexion dysfunction and dural tightness. She was sent home with slump and SLR flossing.  She would benefit from PT at this time to improve strength, flexibility  and function.    A history with 3 or more personal factors and/or comorbidities that impact the plan of care.  An examination of body system(s) using standardized test and measures addressing 3-4 elements from any of the following: body structures and functions, activity limitations and/or participation restrictions.  A clinical presentation with stable and/or uncomplicated characteristics.  A Low Complexity evaluation was performed today.    Prognosis:  Good    Contraindications/Precautions/Limitation:  monitor fatigue,  recent back surgery    Goal Length Status   Pt will demonstrate I and compliance with current HEP in 2 weeks. Short Term New   Pt will demonstrate good understanding of post op precautions and activity modifications recommended by PT in 2 weeks. Short Term New   Pt will demonstrate a 6 point or 12% improvement in Modified Oswestry Disability Questionnaire (ODQ)  score in order to demonstrate a clinically significant improvement of ADL performance and functional ability in 8 weeks.  Long Term New   Will be able to flex forward without endrange pain in order to put on her shoes indicating resolved flexion dysfunction in 8 weeks Long Term New   Will improve 3 measured dynanometer LE measurements showing improved force output to allow her to increase activity 20 % in 8 weeks Long Term New   Will be able to demonstrate proper lifting and body mechanics to allow her to lift and carry her laudry basket up and down stairs in 4 weeks Long Term New   Pt will demonstrate good knowledge and I with final HEP in order to transition to gym program in 8 weeks. Long Term New       Treatment Plan:   Anticipated plan of care reviewed with patient and/or family:  Yes  Freq 2 times per week for 8 week(s)    Treatment plan inclusive of:   Exercise: Stretching, Strengthening, Progressive Resistive, dural glides, functional strengthening   Manual Techniques:  N/A   Modalities: Cryotherapy   Functional: Functional rehab, Agilities, Balance , Endurance training    Treatment today consisted of:    Details      HEP INSTRUCTION   Sitting slump and slr flossing              Thank you for referring this patient to [facility].    Sharrod Achille, PT         TIME    Service-Based Procedures/ Modalities 60   Evaluation 60   Re-evaluation    Traction, mechanical    Electric stimulation (unattended)    Total service-based billable procedures 60        Time-Based  Procedures / Modalities 0   Aquatic therapy    Electric stimulation (manual)    Iontophoresis    Contrast baths    Ultrasound    Physical Performance Test    Therapeutic ex    Neuromuscular reed    Gait training, including stairs    Manual Therapy    Massage    Therapeutic Activities    Total Time-Based Billable Procedures 0       Total Treatment Minutes  60

## 2017-09-22 ENCOUNTER — Ambulatory Visit: Payer: Medicaid (Managed Care) | Admitting: Rehabilitative and Restorative Service Providers"

## 2017-09-26 ENCOUNTER — Encounter: Payer: Self-pay | Admitting: Rehabilitative and Restorative Service Providers"

## 2017-09-27 ENCOUNTER — Ambulatory Visit: Payer: Medicaid (Managed Care) | Admitting: Rehabilitative and Restorative Service Providers"

## 2017-09-27 DIAGNOSIS — Z9889 Other specified postprocedural states: Secondary | ICD-10-CM

## 2017-09-27 DIAGNOSIS — R531 Weakness: Secondary | ICD-10-CM

## 2017-09-27 DIAGNOSIS — M461 Sacroiliitis, not elsewhere classified: Secondary | ICD-10-CM

## 2017-09-27 NOTE — Progress Notes (Signed)
[facility]  Daily Note    Name: Toni AmendDebra Montelongo  DOB: 08/23/1965  Referring Physician: Erskine SquibbBoev, Angel, MD  Todays Date: 09/27/2017    Diagnosis:   1. Sacroiliitis     2. History of lumbar laminectomy     3. Weakness           Subjective:  Very consistent with slump exercises.    Feels better    Objective:    Treatment: Details   THERAPEUTIC EXERCISE    Nustep     Level 4 x 10 min   gastroc stretch on stairs  30 sec x 2    High march walk   12 feet x 4   Sidestep  With red band 12 feet x 4   Red band forward and out 12 feet x 4   Sitting on ball Feet together with TA eyes open and closed.    Standing ext  10   Reviewed slump exercise Correct and no back pain during       MANUAL            NEUROMUSCULAR RE-ED            THERAPEUTIC ACTIVITY            GAIT        MODALITIES    N/A        OTHER            Assessment:    ROM        Swelling        Strength            Education:  Verbal cues for ther ex    Assessment:  Difficulty with unilateral stance on left and keeping pelvis straight  during forward and out.   Did not push patient beyond fatigue of back due to fear of patient of return of symptoms.  Discussed with her that if she did ok will increase       Plan of Care: Continue with progression of treatment to be guided by patient response.  Add functional strengthening.     Thank you for referring this patient to [facility].    Daily Crate, PT       TIME + FUNCTIONAL REPORTING    Total Minutes of treatment        Total Non-Treatment time(rest, etc)    Total Service Based Min. of Treatment 0   Total Time-Based minutes of treatment 42           Service-Based Procedures/ Modalities    Evaluation (high, moderate, low)    Re-evaluation    Traction, mechanical    Electric stimulation (unattended)    Total service-based billable procedures        Time-Based  Procedures / Modalities    Aquatic therapy    Electric stimulation (manual)       Iontophoresis    Ultrasound    Physical Performance Test    Therapeutic ex 42   Neuromuscular reed    Gait training, including stairs    Manual Therapy    Massage    Therapeutic Activities    Total Time-Based Billable Procedures 42

## 2017-09-29 ENCOUNTER — Ambulatory Visit: Payer: Medicaid (Managed Care) | Admitting: Rehabilitative and Restorative Service Providers"

## 2017-09-29 DIAGNOSIS — Z9889 Other specified postprocedural states: Secondary | ICD-10-CM

## 2017-09-29 DIAGNOSIS — R531 Weakness: Secondary | ICD-10-CM

## 2017-09-29 DIAGNOSIS — M461 Sacroiliitis, not elsewhere classified: Secondary | ICD-10-CM

## 2017-09-29 NOTE — Progress Notes (Signed)
[facility]  Daily Note    Name: Bonnie AmendDebra Knaggs  DOB: 12/17/1965  Referring Physician: Erskine SquibbBoev, Angel, MD  Todays Date: 09/29/2017    Diagnosis:   1. Sacroiliitis     2. History of lumbar laminectomy     3. Weakness         S: I am feeling good today, I brought my book of exercises so we can go over which ones I can start doing again at home.  O:  Therapeutic Exercise  Details    Nu-step Dynamic warm up, seat at 8, no arms, level 4 x10 minutes. (3 treatment minutes)   Hip progression Patient in side lying, performed the following in order without rest between for 3 reps of each: small range hip abduction, knee to chest, large range hip abduction, bicycle, forward circles, reverse circles.  2 sets performed with BLE. (8 treatment minutes)   Clams  Patient in side lying with knees and hips flexed.  Performed 2x8 repetitions on BLE.  Verbal cues to keep hips in neutral. (4 treatment minutes)   Core stability  Patient seated on blue therapy ball with narrow base of support and eyes closed. No UE support.  Held for 30 seconds x3.  Verbal cues to facilitate transversus abdominis contraction.  Stand by assist provided. (4 treatment minutes)   Core stability  Patient seated on blue therapy ball facing mirror for visual feedback.  Performed slow marching with verbal cues to maintain transversus abdominis contraction and keep pelvis neutral.  3x15 repetitions.  Stand by assist provided.  (5 treatment minutes)   Box squats  Patient standing in front of standard height chair with arms crossed over chest.  Verbal cues to maintain core contraction and to squat until hips tap chair then stand back up. 2x5 repetitions.  Stand by assist provided. (6 treatment minutes)   Monster walk Red theraband placed around patients ankles.  Performed for 50 feet x2. Verbal cues for to maintain eccentric control and core contraction. Stand by assist provided. (6 treatment minutes)      Billing:  Therapeutic Exercise: 2 units (36 treatment minutes)  A: Patient presents with dynamic balance deficits as evidenced by use of high guard UE during monster walks to maintain balance.  The patient continues to tolerate therapeutic exercise well without any adverse effects noted.  While performing box squats the patient did report bilateral low back pain that resolved following 10 repetitions of high marching and 2 back bends.  Patient was then able to finish second set of box squats without pain.  However, the patient was able to perform increased volume and load, therefore therapeutic exercise will continue to be progressed to the patients tolerance. Continued skilled physical therapy is indicated to facilitate strength and balance improvements while controlling pain.  This will allow the patient to return to her recreational activities and perform household chores without limitation.   P: Progress therapeutic exercise to patients tolerance.  Step ups and downs with focus on maintaining neutral pelvis.        [facility].    Keeshia Sanderlin, PT       TIME + FUNCTIONAL REPORTING    Total Minutes of treatment 36       Total Non-Treatment time(rest, etc)    Total Service Based Min. of Treatment 0   Total Time-Based minutes of treatment 36  Service-Based Procedures/ Modalities    Evaluation (high, moderate, low)    Re-evaluation    Traction, mechanical    Electric stimulation (unattended)    Total service-based billable procedures 0       Time-Based  Procedures / Modalities 36   Aquatic therapy    Electric stimulation (manual)    Iontophoresis    Ultrasound    Physical Performance Test    Therapeutic ex 36   Neuromuscular reed    Gait training, including stairs    Manual Therapy    Massage    Therapeutic Activities    Total Time-Based Billable Procedures 36

## 2017-10-03 ENCOUNTER — Ambulatory Visit: Payer: Medicaid (Managed Care) | Admitting: Rehabilitative and Restorative Service Providers"

## 2017-10-03 DIAGNOSIS — R531 Weakness: Secondary | ICD-10-CM

## 2017-10-03 DIAGNOSIS — M461 Sacroiliitis, not elsewhere classified: Secondary | ICD-10-CM

## 2017-10-03 DIAGNOSIS — Z9889 Other specified postprocedural states: Secondary | ICD-10-CM

## 2017-10-03 NOTE — Progress Notes (Signed)
[facility]  Daily Note    Name: Bonnie Faulkner  DOB: 1966/03/30  Referring Physician: Erskine Squibb, MD  Todays Date: 10/03/2017    Diagnosis:   1. Sacroiliitis     2. History of lumbar laminectomy     3. Weakness         S: I am feeling good today, I brought my book of exercises so we can go over which ones I can start doing again at home.  O:  Therapeutic Exercise  Details    Nu-step Dynamic warm up, seat at 8, no arms, level 5 x10 min 30 sec(3 treatment minutes)   Hip progression  No performed Patient in side lying, performed the following in order without rest between for 3 reps of each: small range hip abduction, knee to chest, large range hip abduction, bicycle, forward circles, reverse circles.  2 sets performed with BLE. (8 treatment minutes)   Clams  Not performed Patient in side lying with knees and hips flexed.  Performed 2x8 repetitions on BLE.  Verbal cues to keep hips in neutral. (4 treatment minutes)   Core stability  Patient seated on blue therapy ball with narrow base of support and eyes closed. No UE support.  Held for 30 seconds x3.  Verbal cues to facilitate transversus abdominis contraction.  Stand by assist provided. (4 treatment minutes)   Core stability  Patient seated on blue therapy ball facing mirror for visual feedback.  Performed slow marching with verbal cues to maintain transversus abdominis contraction and keep pelvis neutral.  3x15 repetitions.  Stand by assist provided.  (5 treatment minutes)   Box squats  At wall minisquats  X 15  5 min   Back ext machine   50 # x 25   5 treatment minutes   Supine  Foam roller  Supine legs 90/90 with foam roller .  Pull x 10 push x 10 twist right twist left x 15  10 treatment minutes   Sit to stand from bosu on high perch X 8 bothered right knee.   4 treatment min   Standing ext  Performed after right buttock pain and it abolished pain.  Discussed symptom management 11 treatment min       Monster  walk Red theraband placed around patients ankles.  Performed for 50 feet x2. Verbal cues for to maintain eccentric control and core contraction. Stand by assist provided. (6 treatment minutes)      Billing: Therapeutic Exercise: 4 units (53 treatment minutes)  A:Patient had right buttock pain after exercises today.  .  Patient having intermittent right or left buttock pain.  She has been able to abolish in the past ith extension.  She reports she does nothing when it happens due to fear.  Explained to her to try to use it to establish a pattern.  Worked though her fears .   Patient asked to come back last session with result   P:  Check on extension to see if pattern established   Check compliance with HEP    6 visits remaining  Thank you for referring this patient to [facility].    Ilyaas Musto, PT       TIME + FUNCTIONAL REPORTING    Total Minutes of treatment 53       Total Non-Treatment time(rest, etc)    Total Service Based Min. of Treatment 0   Total Time-Based minutes of treatment 53  Service-Based Procedures/ Modalities    Evaluation (high, moderate, low)    Re-evaluation    Traction, mechanical    Electric stimulation (unattended)    Total service-based billable procedures 0       Time-Based  Procedures / Modalities 53   Aquatic therapy    Electric stimulation (manual)    Iontophoresis    Ultrasound    Physical Performance Test    Therapeutic ex 53   Neuromuscular reed    Gait training, including stairs    Manual Therapy    Massage    Therapeutic Activities    Total Time-Based Billable Procedures 53

## 2017-10-04 ENCOUNTER — Encounter: Payer: Self-pay | Admitting: Sleep Medicine

## 2017-10-04 DIAGNOSIS — G4733 Obstructive sleep apnea (adult) (pediatric): Secondary | ICD-10-CM

## 2017-10-04 MED ORDER — AMPHETAMINE-DEXTROAMPHETAMINE 20 MG PO TABS *I*
ORAL_TABLET | ORAL | 0 refills | Status: DC
Start: 2017-10-04 — End: 2017-11-01

## 2017-10-06 ENCOUNTER — Ambulatory Visit: Payer: Medicaid (Managed Care) | Admitting: Rehabilitative and Restorative Service Providers"

## 2017-10-06 DIAGNOSIS — Z9889 Other specified postprocedural states: Secondary | ICD-10-CM

## 2017-10-06 DIAGNOSIS — R531 Weakness: Secondary | ICD-10-CM

## 2017-10-06 DIAGNOSIS — M461 Sacroiliitis, not elsewhere classified: Secondary | ICD-10-CM

## 2017-10-06 NOTE — Progress Notes (Signed)
[facility]  Daily Note    Name: Bonnie Faulkner  DOB: 07/27/1966  Referring Physician: Erskine SquibbBoev, Angel, MD  Todays Date: 10/06/2017    Diagnosis:   1. Sacroiliitis     2. History of lumbar laminectomy     3. Weakness       S:  Has been doing exercises proactively and feels great    Therapeutic Exercise  Details    Nu-step Dynamic warm up, seat at 8, no arms, level 5 x10 min 15 sec(3 treatment minutes)  1000 steps    Hip progression  Not performed Patient in side lying, performed the following in order without rest between for 3 reps of each: small range hip abduction, knee to chest, large range hip abduction, bicycle, forward circles, reverse circles.  2 sets performed with BLE. (8 treatment minutes)   Clams  Not performed Patient in side lying with knees and hips flexed.  Performed 2x8 repetitions on BLE.  Verbal cues to keep hips in neutral. (4 treatment minutes)   Core stability  Not performed Patient seated on blue therapy ball with narrow base of support and eyes closed. No UE support.  Held for 30 seconds x3.  Verbal cues to facilitate transversus abdominis contraction.  Stand by assist provided. (4 treatment minutes)   Core stability  Patient seated on blue therapy ball facing mirror for visual feedback.  Performed slow marching with verbal cues to maintain transversus abdominis contraction and keep pelvis neutral.  3x15 repetitions.  Stand by assist provided.  (5 treatment minutes)       Back ext machine   50 # x 15   70# 2x15 5 treatment minutes   Supine  Foam roller  Supine legs 90/90 with foam roller .  Pull x 10 push x 10 twist right twist left x 15  10 treatment minutes  5 sec hold       Standing ext  Performed after right buttock pain and it abolished pain.  Discussed symptom management 11 treatment min   HEP   ball Ball exercise;  Sitting with TA and march,  LAQ,   Prone over ball upper trunk ext,  Supine walkout with hip abd add,  Feet on ball  With glut  squeeze  Ball from hands to feet.     Monster walk  HEP  Red theraband placed around patients ankles.  Performed for 50 feet x2. Verbal cues for to maintain eccentric control and core contraction. Stand by assist provided.forward and out forward and back.  Side step      Billing: Therapeutic Exercise: 4 units (53 treatment minutes)  A;  Able to use realignment exercises and reports she has been feeling since.  Able to add ball exercises and band exercise for home     P:  Add to gym program.  Extension based but has right knee issues    5 visits remaining  Thank you [facility] this [facility].    Angad Nabers, PT       TIME + FUNCTIONAL REPORTING    Total Minutes of treatment 53       Total Non-Treatment time(rest, etc)    Total Service Based Min. of Treatment 0   Total Time-Based minutes of treatment 53           Service-Based Procedures/ Modalities    Evaluation (high, moderate, low)    Re-evaluation    Traction, mechanical  Electric stimulation (unattended)    Total service-based billable procedures 0       Time-Based  Procedures / Modalities 53   Aquatic therapy    Electric stimulation (manual)    Iontophoresis    Ultrasound    Physical Performance Test    Therapeutic ex 53   Neuromuscular reed    Gait training, including stairs    Manual Therapy    Massage    Therapeutic Activities    Total Time-Based Billable Procedures 53

## 2017-10-10 ENCOUNTER — Ambulatory Visit: Payer: Medicaid (Managed Care) | Admitting: Rehabilitative and Restorative Service Providers"

## 2017-10-11 ENCOUNTER — Ambulatory Visit: Payer: Medicaid (Managed Care) | Attending: Neurosurgery

## 2017-10-11 DIAGNOSIS — M461 Sacroiliitis, not elsewhere classified: Secondary | ICD-10-CM

## 2017-10-11 DIAGNOSIS — M25561 Pain in right knee: Secondary | ICD-10-CM | POA: Insufficient documentation

## 2017-10-11 DIAGNOSIS — R531 Weakness: Secondary | ICD-10-CM

## 2017-10-11 DIAGNOSIS — Z9889 Other specified postprocedural states: Secondary | ICD-10-CM | POA: Insufficient documentation

## 2017-10-11 DIAGNOSIS — G8929 Other chronic pain: Secondary | ICD-10-CM | POA: Insufficient documentation

## 2017-10-11 DIAGNOSIS — M25562 Pain in left knee: Secondary | ICD-10-CM | POA: Insufficient documentation

## 2017-10-11 NOTE — Progress Notes (Signed)
[facility]  Daily [facility]    Name: Toni AmendDebra Dudzinski  DOB: 07/23/1966  Referring Physician: Erskine SquibbBoev, Angel, MD  Todays Date: 10/11/2017    Diagnosis:   1. Sacroiliitis     2. History of lumbar laminectomy     3. Weakness           Subjective:  Feels achy because the muscles are working.  Overall feeling better.      Objective:    Therapeutic Exercise  Details    Nu-step Dynamic warm up, seat at 8, no arms, level 5 x10 min 30 sec(3 treatment minutes)     Hip progression  Not performed Patient in side lying, performed the following in order without rest between for 3 reps of each: small range hip abduction, knee to chest, large range hip abduction, bicycle, forward circles, reverse circles.  2 sets performed with BLE. (8 treatment minutes)   Clams  Not performed Patient in side lying with knees and hips flexed.  Performed 2x8 repetitions on BLE.  Verbal cues to keep hips in neutral. (4 treatment minutes)   Core stability  Not performed Patient seated on blue therapy ball with narrow base of support and eyes closed. No UE support.  Held for 30 seconds x3.  Verbal cues to facilitate transversus abdominis contraction.  Stand by assist provided. (4 treatment minutes)   Core stability  Patient seated on blue therapy ball:  Performed slow marching with verbal cues to maintain transversus abdominis contraction and keep pelvis neutral, alternating arm raises, and alternating arm and leg.  3x15 repetitions.  Stand by assist provided. In standing:  TA contraction with high marches 4 x 10 ft.   Stair Stretches Gastroc stretches 3 x 30 seconds completed bilaterally   Back ext machine   70# 1x15  80# 2x15    Supine  Foam roller  Supine legs 90/90 with foam roller .  Pull x 10 push x 10 twist right twist left x 15  10 treatment minutes  5 sec hold       Standing ext - Not performed Performed after right buttock pain and it abolished pain.  Discussed symptom management 11 treatment min   HEP    Ball - Not performed Ball exercise;  Sitting with TA and march,  LAQ,   Prone over ball upper trunk ext,  Supine walkout with hip abd add,  Feet on ball  With glut squeeze  Ball from hands to feet.     Monster walk - Note performed  HEP  Red theraband placed around patients ankles.  Performed for 50 feet x2. Verbal cues for to maintain eccentric control and core contraction. Stand by assist provided.forward and out forward and back.  Side step       Assessment:  Patient remains motivated to improve core stability for overall general strengthening and symptom management.  Demonstrates good form with exercises and fatigues.  Overall, progress noted with core stability and pain control.       Plan of Care: Continue with core stability and symptom management.    [facility] you [facility].    Irving CopasJacinda A Kayceon Oki, PTA         Time Reporting    Service-Based Procedures/ Modalities    Evaluation (high, moderate, low)    Re-evaluation    Traction, mechanical    Electric stimulation (unattended)    Total service-based billable procedures  Time-Based  Procedures / Modalities    Therapeutic ex 45   Neuromuscular Re-ed    Manual Therapy    Therapeutic Activities    Gait training, including stairs    Ultrasound    Electrical Stimulation    Iontophoresis    Physical Performance Test    Aquatic Therapy    Total Time-Based Billable Procedures 45       Total Treatment Time 45

## 2017-10-13 ENCOUNTER — Ambulatory Visit: Payer: Medicaid (Managed Care) | Admitting: Rehabilitative and Restorative Service Providers"

## 2017-10-13 ENCOUNTER — Other Ambulatory Visit
Admission: RE | Admit: 2017-10-13 | Discharge: 2017-10-13 | Disposition: A | Discharge status: Home / Self Care | Payer: Medicaid (Managed Care) | Source: Ambulatory Visit | Attending: Internal Medicine | Admitting: Internal Medicine

## 2017-10-13 DIAGNOSIS — Z9889 Other specified postprocedural states: Secondary | ICD-10-CM

## 2017-10-13 DIAGNOSIS — E7800 Pure hypercholesterolemia, unspecified: Secondary | ICD-10-CM | POA: Insufficient documentation

## 2017-10-13 DIAGNOSIS — R7301 Impaired fasting glucose: Secondary | ICD-10-CM | POA: Insufficient documentation

## 2017-10-13 DIAGNOSIS — R531 Weakness: Secondary | ICD-10-CM

## 2017-10-13 DIAGNOSIS — M461 Sacroiliitis, not elsewhere classified: Secondary | ICD-10-CM

## 2017-10-13 LAB — LIPID PANEL
Chol/HDL Ratio: 2.4
Cholesterol: 191 mg/dL
HDL: 80 mg/dL
LDL Calculated: 84 mg/dL
Non HDL Cholesterol: 111 mg/dL
Triglycerides: 135 mg/dL

## 2017-10-13 LAB — COMPREHENSIVE METABOLIC PANEL
ALT: 19 U/L (ref 0–35)
AST: 23 U/L (ref 0–35)
Albumin: 4.3 g/dL (ref 3.5–5.2)
Alk Phos: 76 U/L (ref 35–105)
Anion Gap: 15 (ref 7–16)
Bilirubin,Total: 0.4 mg/dL (ref 0.0–1.2)
CO2: 21 mmol/L (ref 20–28)
Calcium: 9.8 mg/dL (ref 8.6–10.2)
Chloride: 105 mmol/L (ref 96–108)
Creatinine: 0.72 mg/dL (ref 0.51–0.95)
GFR,Black: 111 *
GFR,Caucasian: 97 *
Glucose: 97 mg/dL (ref 60–99)
Lab: 15 mg/dL (ref 6–20)
Potassium: 4.2 mmol/L (ref 3.4–4.7)
Sodium: 141 mmol/L (ref 133–145)
Total Protein: 7.6 g/dL (ref 6.3–7.7)

## 2017-10-13 NOTE — Progress Notes (Signed)
[facility]  Daily [facility]    Name: Bonnie Faulkner  DOB: 07/08/1966  Referring Physician: Erskine SquibbBoev, Angel, MD  Todays Date: 10/13/2017    Diagnosis:   1. Sacroiliitis     2. History of lumbar laminectomy     3. Weakness           Subjective:  Feels achy because the muscles are working.  Overall feeling better.      Objective:    Therapeutic Exercise  Details     Nu-step Dynamic warm up, seat at 8, no arms, level 5 x10 min 30 sec(3 treatment minutes)     Hip progression  Not performed Patient in side lying, performed the following in order without rest between for 3 reps of each: small range hip abduction, knee to chest, large range hip abduction, bicycle, forward circles, reverse circles.  2 sets performed with BLE. (8 treatment minutes)   Clams  Not performed Patient in side lying with knees and hips flexed.  Performed 2x8 repetitions on BLE.  Verbal cues to keep hips in neutral. (4 treatment minutes)   Core stability  Not performed Patient seated on blue therapy ball with narrow base of support and eyes closed. No UE support.  Held for 30 seconds x3.  Verbal cues to facilitate transversus abdominis contraction.  Stand by assist provided. (4 treatment minutes)   Core stability  Not performed Patient seated on blue therapy ball:  Performed slow marching with verbal cues to maintain transversus abdominis contraction and keep pelvis neutral, alternating arm raises, and alternating arm and leg.  3x15 repetitions.  Stand by assist provided. In standing:  TA contraction with high marches 4 x 10 ft.   Stair Stretches not performed  Gastroc stretches 3 x 30 seconds completed bilaterally    Back ext machine    80# 3x15     Prone glut  Prone knee flexion with  Hip ext to focus on glut.     Ball exercises  Supine roll out with green band around knees and hip ER ,  And   Supine walk out with green band overhead    Supine  Foam roller  Supine legs 90/90 with foam roller .  Pull x 10  push x 10 twist right twist left x 15  10 treatment minutes  5 sec hold     resisted walking  Blue band at ankles from therapist to patient with resisted forward walk 200 feet with cg and min assist 3 x for balance.  Then sidestep right and left with band 30 feet and then resisted forward and back walk 30 feet with occasional grabbing of rail for balance.      Standing ext - Not performed Performed after right buttock pain and it abolished pain.  Discussed symptom management 11 treatment min   HEP   Ball - Not performed Ball exercise;  Sitting with TA and march,  LAQ,   Prone over ball upper trunk ext,  Supine walkout with hip abd add,  Feet on ball  With glut squeeze  Ball from hands to feet.     Monster walk - Note performed  HEP  Red theraband placed around patients ankles.  Performed for 50 feet x2. Verbal cues for to maintain eccentric control and core contraction. Stand by assist provided.forward and out forward and back.  Side step       Assessment:  New resisted ambulation challenging for balance and strength.  Still needs vc  to focus on form and slow down movements.          Plan of Care: Continue with core stability and symptom management.    Thank you for referring this patient to [facility].    Grethel Zenk, PT         Time Reporting    Service-Based Procedures/ Modalities 0   Evaluation (high, moderate, low)    Re-evaluation    Traction, mechanical    Electric stimulation (unattended)    Total service-based billable procedures 0       Time-Based  Procedures / Modalities    Therapeutic ex 53   Neuromuscular Re-ed    Manual Therapy    Therapeutic Activities    Gait training, including stairs    Ultrasound    Electrical Stimulation    Iontophoresis    Physical Performance Test    Aquatic Therapy    Total Time-Based Billable Procedures 53       Total Treatment Time 53

## 2017-10-13 NOTE — Telephone Encounter (Signed)
Error

## 2017-10-14 LAB — HEMOGLOBIN A1C: Hemoglobin A1C: 5.5 %

## 2017-10-17 ENCOUNTER — Ambulatory Visit: Payer: Medicaid (Managed Care) | Admitting: Rehabilitative and Restorative Service Providers"

## 2017-10-17 DIAGNOSIS — M461 Sacroiliitis, not elsewhere classified: Secondary | ICD-10-CM

## 2017-10-17 DIAGNOSIS — Z9889 Other specified postprocedural states: Secondary | ICD-10-CM

## 2017-10-17 DIAGNOSIS — R531 Weakness: Secondary | ICD-10-CM

## 2017-10-17 NOTE — Progress Notes (Signed)
[facility]  Daily Note    Name: Bonnie Faulkner  DOB: [birth year]  Referring Physician: Erskine Squibb, MD  Todays Date: 10/17/2017    Diagnosis:   1. Sacroiliitis     2. History of lumbar laminectomy     3. Weakness           Subjective:    Has been doing new exercises at home.  Feels great    Objective:    Therapeutic Exercise  Details     Nu-step Dynamic warm up, seat at 8, no arms, level 5 x10 min 30 sec(3 treatment minutes)     Hip progression  Not performed Patient in side lying, performed the following in order without rest between for 3 reps of each: small range hip abduction, knee to chest, large range hip abduction, bicycle, forward circles, reverse circles.  2 sets performed with BLE. (8 treatment minutes)   Clams  Not performed Patient in side lying with knees and hips flexed.  Performed 2x8 repetitions on BLE.  Verbal cues to keep hips in neutral. (4 treatment minutes)   Core stability  Not performed Patient seated on blue therapy ball with narrow base of support and eyes closed. No UE support.  Held for 30 seconds x3.  Verbal cues to facilitate transversus abdominis contraction.  Stand by assist provided. (4 treatment minutes)   Core stability  Not performed Patient seated on blue therapy ball:  Performed slow marching with verbal cues to maintain transversus abdominis contraction and keep pelvis neutral, alternating arm raises, and alternating arm and leg.  3x15 repetitions.  Stand by assist provided. In standing:  TA contraction with high marches 4 x 10 ft.   Stair Stretches not performed  Gastroc stretches 3 x 30 seconds completed bilaterally    Back ext machine    80# 3x15    Prone glut   Not performed Prone knee flexion with  Hip ext to focus on glut.    Ball exercises  Not performed Supine roll out with green band around knees and hip ER ,  And   Supine walk out with green band overhead    Supine  Foam roller  Supine legs 90/90 with foam roller .  Pull  x 10 push x 10 twist right twist left x 15  10 treatment minutes  5 sec hold     resisted walking  Blue band at ankles from therapist to patient with resisted forward walk 200 feet with cg and min assist 2 x for balance.  Then sidestep right and left with band 30 feet and then resisted forward and back walk 30 feet with occasional grabbing of rail for balance.     Then band wrapped with one person on each side to isolate adductors.  Out and in motion to tandom line  20 feet x 2    Cg to min assist   Standing ext - Not performed Performed after right buttock pain and it abolished pain.  Discussed symptom management 11 treatment min   HEP   Ball - Not performed Ball exercise;  Sitting with TA and march,  LAQ,   Prone over ball upper trunk ext,  Supine walkout with hip abd add,  Feet on ball  With glut squeeze  Ball from hands to feet.     Monster walk - Note performed  HEP  Red theraband placed around patients ankles.  Performed for 50 feet x2. Verbal cues for to maintain eccentric control  and core contraction. Stand by assist provided.forward and out forward and back.  Side step       Assessment:      Plan of Care: progress note in 2 visit  Either discharge or continue with script for knee    [facility] you [facility]    Bonnie Faulkner, PT         Time Reporting    Service-Based Procedures/ Modalities 0   Evaluation (high, moderate, low)    Re-evaluation    Traction, mechanical    Electric stimulation (unattended)    Total service-based billable procedures 0       Time-Based  Procedures / Modalities    Therapeutic ex 40   Neuromuscular Re-ed    Manual Therapy    Therapeutic Activities    Gait training, including stairs    Ultrasound    Electrical Stimulation    Iontophoresis    Physical Performance Test    Aquatic Therapy    Total Time-Based Billable Procedures 40       Total Treatment Time 40

## 2017-10-19 ENCOUNTER — Encounter: Payer: Self-pay | Admitting: Gastroenterology

## 2017-10-20 ENCOUNTER — Ambulatory Visit: Payer: Medicaid (Managed Care) | Admitting: Rehabilitative and Restorative Service Providers"

## 2017-10-20 DIAGNOSIS — Z9889 Other specified postprocedural states: Secondary | ICD-10-CM

## 2017-10-20 DIAGNOSIS — R531 Weakness: Secondary | ICD-10-CM

## 2017-10-20 DIAGNOSIS — M461 Sacroiliitis, not elsewhere classified: Secondary | ICD-10-CM

## 2017-10-20 NOTE — Progress Notes (Signed)
[facility]  Daily Note    Name: Bonnie Faulkner  DOB: 04/22/1966  Referring Physician: Erskine SquibbBoev, Angel, MD  Todays Date: 10/20/2017    Diagnosis:   1. Sacroiliitis     2. History of lumbar laminectomy     3. Weakness           Subjective:  Back feels great    Objective:    Therapeutic Exercise  Details     Nu-step Dynamic warm up, seat at 8, no arms, level 5 x10 min 30 sec(3 treatment minutes)     Hip progression  Not performed Patient in side lying, performed the following in order without rest between for 3 reps of each: small range hip abduction, knee to chest, large range hip abduction, bicycle, forward circles, reverse circles.  2 sets performed with BLE. (8 treatment minutes)   Clams  Not performed Patient in side lying with knees and hips flexed.  Performed 2x8 repetitions on BLE.  Verbal cues to keep hips in neutral. (4 treatment minutes)   Core stability  Not performed Patient seated on blue therapy ball with narrow base of support and eyes closed. No UE support.  Held for 30 seconds x3.  Verbal cues to facilitate transversus abdominis contraction.  Stand by assist provided. (4 treatment minutes)   Core stability  Not performed Patient seated on blue therapy ball:  Performed slow marching with verbal cues to maintain transversus abdominis contraction and keep pelvis neutral, alternating arm raises, and alternating arm and leg.  3x15 repetitions.  Stand by assist provided. In standing:  TA contraction with high marches 4 x 10 ft.   Stair Stretches not performed  Gastroc stretches 3 x 30 seconds completed bilaterally    Back ext machine    80# 3x15     Prone glut   Prone knee flexion with  Hip ext to focus on glut.    Ball exercises  Not performed Supine roll out with green band around knees and hip ER ,  And   Supine walk out with green band overhead    Supine  Foam roller  Supine legs 90/90 with foam roller .  Pull x 10 push x 10 twist right twist left x 15  10  treatment minutes  5 sec hold    Pulley walk  Then band wrapped with one person on each side to isolate adductors.  Out and in motion to tandom line  20 feet x 2    Cg to min assist  .   Pulley walk 30 pounds forward stepping over hurdles.    Cg.  Then return back too difficult.  Return forward with reisistance x 5,   Then side step 20 # right and left x 5 each  Cg  With occasional min assist for lob    Back strong  45 degree angle   Back ext x 10,  Mini crunch x 10,  Side crunch x 10 right and left     resisted walking   Not performed Blue band at ankles from therapist to patient with resisted forward walk 200 feet with cg and min assist 2 x for balance.  Then sidestep right and left with band 30 feet and then resisted forward and back walk 30 feet with occasional grabbing of rail for balance.     Then band wrapped with one person on each side to isolate adductors.  Out and in motion to tandom line  20 feet x  2    Cg to min assist   Standing ext - Not performed Performed after right buttock pain and it abolished pain.  Discussed symptom management 11 treatment min   HEP   Ball - Not performed Ball exercise;  Sitting with TA and march,  LAQ,   Prone over ball upper trunk ext,  Supine walkout with hip abd add,  Feet on ball  With glut squeeze  Ball from hands to feet.     Monster walk - Note performed  HEP  Red theraband placed around patients ankles.  Performed for 50 feet x2. Verbal cues for to maintain eccentric control and core contraction. Stand by assist provided.forward and out forward and back.  Side step       Assessment: Patient doing very well with general strength , core and back exercises.  Anticipate only 1-2 more visits prior to discharge       Plan of Care 1-2 visits for the back and discharge.  Evaluate knee as patient brought a new script    Thank you for referring this patient to [facility].    Jailee Jaquez, PT         Time Reporting    Service-Based  Procedures/ Modalities 0   Evaluation (high, moderate, low)    Re-evaluation    Traction, mechanical    Electric stimulation (unattended)    Total service-based billable procedures 0       Time-Based  Procedures / Modalities    Therapeutic ex 55   Neuromuscular Re-ed    Manual Therapy    Therapeutic Activities    Gait training, including stairs    Ultrasound    Electrical Stimulation    Iontophoresis    Physical Performance Test    Aquatic Therapy    Total Time-Based Billable Procedures 55       Total Treatment Time 55

## 2017-10-21 ENCOUNTER — Other Ambulatory Visit: Payer: Self-pay | Admitting: Internal Medicine

## 2017-10-21 ENCOUNTER — Ambulatory Visit
Admission: RE | Admit: 2017-10-21 | Discharge: 2017-10-21 | Disposition: A | Discharge status: Home / Self Care | Payer: Medicaid (Managed Care) | Source: Ambulatory Visit | Attending: Internal Medicine | Admitting: Internal Medicine

## 2017-10-21 DIAGNOSIS — N6311 Unspecified lump in the right breast, upper outer quadrant: Secondary | ICD-10-CM

## 2017-10-21 DIAGNOSIS — N6310 Unspecified lump in the right breast, unspecified quadrant: Secondary | ICD-10-CM

## 2017-10-21 DIAGNOSIS — R928 Other abnormal and inconclusive findings on diagnostic imaging of breast: Secondary | ICD-10-CM | POA: Insufficient documentation

## 2017-10-21 DIAGNOSIS — Z1239 Encounter for other screening for malignant neoplasm of breast: Secondary | ICD-10-CM

## 2017-10-24 ENCOUNTER — Ambulatory Visit: Payer: Medicaid (Managed Care) | Admitting: Rehabilitative and Restorative Service Providers"

## 2017-10-24 DIAGNOSIS — R531 Weakness: Secondary | ICD-10-CM

## 2017-10-24 DIAGNOSIS — M25561 Pain in right knee: Secondary | ICD-10-CM

## 2017-10-24 DIAGNOSIS — G8929 Other chronic pain: Secondary | ICD-10-CM

## 2017-10-24 DIAGNOSIS — M25562 Pain in left knee: Secondary | ICD-10-CM

## 2017-10-24 DIAGNOSIS — Z9889 Other specified postprocedural states: Secondary | ICD-10-CM

## 2017-10-24 NOTE — Progress Notes (Signed)
Sent via: Fax    Physician attestation for Plan of Care: Physician:  Dr. Raford Pitcher  Per signature, I have reviewed and agree with the documented plan of care.    ____________________________________________________________  Plan of Care     The physician's co-signature on this note indicates that they have reviewed this evaluation and agree with the documented goals and plan of care.            [facility]  Lower Extremity Initial Evaluation      Name: Bonnie Faulkner  DOB: May 21, 1966  Referring Physician: Salvadore Dom, MD  Todays Date: 10/24/2017    Diagnosis:   1. Knee pain, left     2. Right knee pain     3. Weakness     4. History of lumbar laminectomy            Subjective    Bonnie Faulkner is a 52 y.o. female who is present today for knee care.     Mechanism of injury/history of symptoms: right knee  Waterskiing and hyperextended it and tore acl and meniscus in 1989  Had surgery   And has had pain ever since .  Been flared up for a few years now.    Left knee no history.  Takes the brunt of her weight when the right is bad  Prior therapy: 89-90 not since    Relevant Comorbidities/Personal Factors: Migraine and idiopathethic hypersomnia.  These are relevant factors as they: may affect a Pt's ability to exercise and response exercise, thus impacting treatment outcome and plan of care.   Surgical history: Lumbar surgery, 07/26/17  laminectomy    Current Outpatient Prescriptions:     valACYclovir (VALTREX) 500 MG tablet, Take 500 mg by mouth 2 times daily, Disp: , Rfl:     amphetamine-dextroamphetamine (ADDERALL) 20 MG tablet, Take one tablet (20mg ) by mouth each morning followed by one half a tablet (10 mg) mid-day MDD=30mg , Disp: 45 tablet, Rfl: 0    meloxicam (MOBIC) 15 MG tablet, Take 1 tablet (15 mg total) by mouth daily   Take with food., Disp: 30 tablet, Rfl: 2    gabapentin (NEURONTIN) 300 MG capsule, Take 600 mg by mouth 3 times daily, Disp: , Rfl:      Escitalopram Oxalate (LEXAPRO PO), Take by mouth, Disp: , Rfl:     multi-vitamin (MULTIVITAMIN) per tablet, Take 1 tablet by mouth daily, Disp: , Rfl:     calcium-vitamin D (OSCAL-500) 500-200 MG-UNIT per tablet, Take 1 tablet by mouth 2 times daily, Disp: , Rfl:     fluticasone (FLONASE) 50 MCG/ACT nasal spray, 1 spray by Nasal route daily, Disp: , Rfl:     ALPRAZolam (XANAX PO), Take by mouth as needed, Disp: , Rfl:     valGANciclovir (VALCYTE) 450 MG tablet, Take 900 mg by mouth daily Swallow whole. Take with food., Disp: , Rfl:     SUMAtriptan (IMITREX) 20 MG/ACT nasal spray, 1 spray by Nasal route as needed for Migraine   Inhale into one nostril at onset of headache. May repeat once in 2 hours., Disp: , Rfl:     Misc. Devices (CPAP) machine, Please issue any necessary supplies for CPAP.  Dx:OSA  Length:life, Disp: 1 each, Rfl: 0     Occupation and Activities    Prior level of function: able to climb stairs reciprocally without holding.  ride stationary bike.    Able to walk on uneven ground  Current   Non reciprocal  stairs.  hasnt tried hiking    Diagnostic tests: none  Symptom location: Right knee anterior  Under kneecap.   Feels pain with pushing, ie bike stairs     Relevant symptoms: grindy clicky and crunchy on the right ;  Dull sharp pain in the middle.    Symptom frequency:  Activity dependent  Symptom intensity (0 - 10 scale): Now 0 Best 0 Worst 5   Right   3  left  Night Pain: No   Restful sleep:   No  Morning Pain/Stiffness: None   Symptoms worsen with: Stairs, Standing,  Rising from a chair  Symptoms improve with: Ice, Activity, Rest  Assistive device:  none  Functional: stairs mild limitation,     Rising from a chair mild  Outcomes: KOS: 66 % perceived ability     Objective  Patellar mobility   Hypermobile right knee cap   Painful tapping on knee cap bilaterally R>L    Knee ROM and hip ROM WNL      Knee MMT Left Right   Quadriceps 72.4 66.3   Hamstrings 26 prone 22 prone     Hip abd                        40                              45  Hip flexion   4/5  4/5      Special Tests:   Pain with step ups sit to stand minisquats   Repeated lock and hold ext bilateral 10 sec x 10   decreased pain with  Step ups squats and sit to stand   Taped knee cap medially with kinesiotape   decreased pain with  Step ups squats and sit to stand         30  Sec sit to stand    8 x       Assessment:  Bonnie Faulkner is  52 year old female with a long history of knee pain R>L.  She generally does not have pain at rest only with " pushing" such as steps , sit to stands and squating.  Her strength is decreased throughout LE  R>L as measured with the dynamometer.  The three activities that cause the most pain are  Squatting, step ups and sit to stand.  She had decreased pain with these activities after lock and hold knee extension with and without ER.  She also had decreased pain when her patella was medialized with kinesiotape.  She would benefit from PT for LE strengthening with focus on  VMO.      A history with 3 or more personal factors and/or comorbidities that impact the plan of care.  An examination of body system(s) using standardized test and measures addressing 1-2 elements from any of the following: body structures and functions, activity limitations and/or participation restrictions.  A clinical presentation with stable and/or uncomplicated characteristics. A Low Complexity evaluation was performed today.     Prognosis:  Good   Contraindications/Precautions/Limitation:  none    Goal Length Status   Will be independent with a home exercise program  In 3 visits Short Term New   Will be able to isolate VMO during lunges in 3 visits Short Term New   Will report having 50%  Less knee pain during stairs   10 visits New   Will  be able to perform 10 sit to stands in 30 sec without arms to improve her ability to get up from low surfaces in 10 visits 10 visits New   Will improve patellar alignment as measured by ability to minisquat  to look into low cupboard 10 visits New     Treatment Plan:   Anticipated plan of care reviewed with patient and/or family:  Yes  Freq 2 times per week for 6 week(s)    Treatment plan inclusive of:   Exercise: Stretching, Strengthening, Progressive Resistive, Coordination, Aerobic exercise   Manual Techniques:  Joint mobilization   Modalities: Cold pack, Electrical Stimulation, attended   Functional: Proprioception/Dynamic stability, Endurance training    Treatment today consisted of:    Treatment: Details   THERAPEUTIC EXERCISE evaluation    Lock and hold knee ext with and without ER       Thank you for referring this patient to [facility].    Charly Holcomb, PT         TIME + FUNCTIONAL REPORTING    Total Minutes of treatment        Total Non-Treatment time(rest, etc)    Total Service Based Min. of Treatment 53   Total Time-Based minutes of treatment            Service-Based Procedures/ Modalities    Evaluation (high, moderate, low) 53   Re-evaluation    Traction, mechanical    Electric stimulation (unattended)    Total service-based billable procedures 53       Time-Based  Procedures / Modalities    Aquatic therapy    Electric stimulation (manual)    Iontophoresis    Ultrasound    Physical Performance Test    Therapeutic ex    Neuromuscular reed    Gait training, including stairs    Manual Therapy    Massage    Therapeutic Activities    Fluidotherapy    Paraffin    Self-care home management    Total Time-Based Billable Procedures 0

## 2017-10-26 ENCOUNTER — Encounter: Payer: Self-pay | Admitting: Rehabilitative and Restorative Service Providers"

## 2017-10-27 ENCOUNTER — Ambulatory Visit: Payer: Medicaid (Managed Care)

## 2017-10-31 ENCOUNTER — Ambulatory Visit: Payer: Medicaid (Managed Care)

## 2017-10-31 DIAGNOSIS — M461 Sacroiliitis, not elsewhere classified: Secondary | ICD-10-CM

## 2017-10-31 DIAGNOSIS — Z9889 Other specified postprocedural states: Secondary | ICD-10-CM

## 2017-10-31 DIAGNOSIS — R531 Weakness: Secondary | ICD-10-CM

## 2017-10-31 NOTE — Progress Notes (Signed)
[facility]  Daily Note    Name: Toni AmendDebra Overfield  DOB: 07/14/1966  Referring Physician: Erskine SquibbBoev, Angel, MD  Todays Date: 10/31/2017    Diagnosis:   1. Weakness     2. History of lumbar laminectomy     3. Sacroiliitis           Subjective:  Patient presents with complaints of increased left sided hip, leg and back pain.  Does not recall doing anything different to trigger increased pain.  Indicates that pain "maybe getting a little better, but, I'm not sure."      Objective:     Therapeutic Exercise  Details     Nu-step Dynamic warm up, seat at 8, no arms, level 5 x10 min 30 sec(3 treatment minutes)     Hip progression  Not performed Patient in side lying, performed the following in order without rest between for 3 reps of each: small range hip abduction, knee to chest, large range hip abduction, bicycle, forward circles, reverse circles.  2 sets performed with BLE. (8 treatment minutes)   Clams  Not performed Patient in side lying with knees and hips flexed.  Performed 2x8 repetitions on BLE.  Verbal cues to keep hips in neutral. (4 treatment minutes)   Core stability  Not performed Patient seated on blue therapy ball with narrow base of support and eyes closed. No UE support.  Held for 30 seconds x3.  Verbal cues to facilitate transversus abdominis contraction.  Stand by assist provided. (4 treatment minutes)   Core stability  Not performed Patient seated on blue therapy ball:  Performed slow marching with verbal cues to maintain transversus abdominis contraction and keep pelvis neutral, alternating arm raises, and alternating arm and leg.  3x15 repetitions.  Stand by assist provided. In standing:  TA contraction with high marches 4 x 10 ft.   Stair Stretches not performed  Gastroc stretches 3 x 30 seconds completed bilaterally    Back ext machine    80# 3x15     Prone glut   Prone knee flexion with  Hip ext to focus on glut. 3 x 10   Ball exercises  Not performed Supine  roll out with green band around knees and hip ER ,  And   Supine walk out with green band overhead    Supine  Foam roller - not performed Supine legs 90/90 with foam roller .  Pull x 10 push x 10 twist right twist left x 15  10 treatment minutes  5 sec hold    Pulley walk - not performed  Then band wrapped with one person on each side to isolate adductors.  Out and in motion to tandom line  20 feet x 2    Cg to min assist  .   Pulley walk 30 pounds forward stepping over hurdles.    Cg.  Then return back too difficult.  Return forward with reisistance x 5,   Then side step 20 # right and left x 5 each  Cg  With occasional min assist for lob    Back strong - not performed  45 degree angle   Back ext x 10,  Mini crunch x 10,  Side crunch x 10 right and left     resisted walking   Not performed Blue band at ankles from therapist to patient with resisted forward walk 200 feet with cg and min assist 2 x for balance.  Then sidestep right and left with  band 30 feet and then resisted forward and back walk 30 feet with occasional grabbing of rail for balance.     Then band wrapped with one person on each side to isolate adductors.  Out and in motion to tandom line  20 feet x 2    Cg to min assist   Standing ext  Performed after manual techniques and all machines   HEP   Ball - Not performed Ball exercise;  Sitting with TA and march,  LAQ,   Prone over ball upper trunk ext,  Supine walkout with hip abd add,  Feet on ball  With glut squeeze  Ball from hands to feet.     Monster walk - Note performed  HEP  Red theraband placed around patients ankles.  Performed for 50 feet x2. Verbal cues for to maintain eccentric control and core contraction. Stand by assist provided.forward and out forward and back.  Side step       Manual SI checked for alignment, noted to be equal, though due to pain completed muscle energy techniques to manage symptoms.  Some decrease with flexion, adduction and abduction, then further decrease with  multiple repetitions into extension (prone)    Standing with left foot on the wall and pushing into hip extension for home exercise program       Assessment:  Patient presents to therapy this date with increased left sided soreness for no apparent reason.  Muscle fatigue noted at the end of the session with decreased soreness.  Good form noted with standing extensions.       Plan of Care: Patient to continue with extension activities, resisted and not resisited for symptom management.    Thank you for referring this patient to [facility].    Irving Copas, PTA         Time Reporting    Service-Based Procedures/ Modalities    Evaluation (high, moderate, low)    Re-evaluation    Traction, mechanical    Electric stimulation (unattended)    Total service-based billable procedures        Time-Based  Procedures / Modalities    Therapeutic ex 30   Neuromuscular Re-ed    Manual Therapy 10   Therapeutic Activities    Gait training, including stairs    Ultrasound    Electrical Stimulation    Iontophoresis    Physical Performance Test    Aquatic Therapy    Total Time-Based Billable Procedures 40       Total Treatment Time 45

## 2017-11-01 ENCOUNTER — Encounter: Payer: Self-pay | Admitting: Sleep Medicine

## 2017-11-01 DIAGNOSIS — G4733 Obstructive sleep apnea (adult) (pediatric): Secondary | ICD-10-CM

## 2017-11-01 MED ORDER — AMPHETAMINE-DEXTROAMPHETAMINE 20 MG PO TABS *I*
ORAL_TABLET | ORAL | 0 refills | Status: DC
Start: 2017-11-01 — End: 2017-12-05

## 2017-11-03 ENCOUNTER — Ambulatory Visit: Payer: Medicaid (Managed Care) | Admitting: Rehabilitative and Restorative Service Providers"

## 2017-11-03 DIAGNOSIS — M25562 Pain in left knee: Secondary | ICD-10-CM

## 2017-11-03 DIAGNOSIS — M25561 Pain in right knee: Secondary | ICD-10-CM

## 2017-11-03 DIAGNOSIS — G8929 Other chronic pain: Secondary | ICD-10-CM

## 2017-11-03 NOTE — Progress Notes (Signed)
[facility]  Daily Note    Name: Bonnie AmendDebra Faulkner  DOB: 08/02/1966  Referring Physician: Erskine SquibbBoev, Angel, MD  Todays Date: 11/03/2017    Diagnosis:   1. Chronic pain of right knee     2. Chronic pain of left knee           Subjective:  Tape rolled up right away .     Going down stairs  Is easier and up slightly easier  .  Patient admits to not doing exercises due to fatigue and depression    Objective:     Treatment: Details   THERAPEUTIC EXERCISE nustep level 4   1000 steps    Back machine  80#  X 30    multihip machine 30 # ext left flexion right x 15,  abd 30 # x 15 right and left       MANUAL Patellar mobs med lat right and  Left leg    Medial mob with quad sets     Taped knee with kinesiotape and  Instructed her on how to tape for home.  She was able to return demonstrate for home   NEUROMUSCULAR RE-ED            Education:  encourged to do HEP    Assessment:  She has used 10 visits for her back and has 4 approved for her knees  Which totals  14 leaving  26 visits. Need to save if needed in the future Discussed need to self manage.  Patient mentally not in a good place right now becoming emotional and frustrated at times.  Complains of extreme fatigue.  Re explained importance of doing only realignment exercises if she has to choose.            Plan of Care: 2 visits left for the knee.    Assess results of kinesiotape  Thank you for referring this patient to [facility].    Ferris Tally, PT       TIME + FUNCTIONAL REPORTING    Total Minutes of treatment        Total Non-Treatment time(rest, etc)    Total Service Based Min. of Treatment 0   Total Time-Based minutes of treatment 45           Service-Based Procedures/ Modalities    Evaluation (high, moderate, low)    Re-evaluation    Traction, mechanical    Electric stimulation (unattended)    Total service-based billable procedures 0       Time-Based  Procedures / Modalities 45      Aquatic therapy    Electric stimulation (manual)    Iontophoresis    Ultrasound    Physical Performance Test    Therapeutic ex    Neuromuscular reed    Gait training, including stairs    Manual Therapy    Massage    Therapeutic Activities    Fluidotherapy    Paraffin    Self-care home management    Total Time-Based Billable Procedures

## 2017-11-07 ENCOUNTER — Ambulatory Visit: Payer: Medicaid (Managed Care) | Attending: Neurosurgery | Admitting: Rehabilitative and Restorative Service Providers"

## 2017-11-07 DIAGNOSIS — G8929 Other chronic pain: Secondary | ICD-10-CM | POA: Insufficient documentation

## 2017-11-07 DIAGNOSIS — R531 Weakness: Secondary | ICD-10-CM

## 2017-11-07 DIAGNOSIS — M25562 Pain in left knee: Secondary | ICD-10-CM | POA: Insufficient documentation

## 2017-11-07 DIAGNOSIS — M461 Sacroiliitis, not elsewhere classified: Secondary | ICD-10-CM | POA: Insufficient documentation

## 2017-11-07 DIAGNOSIS — Z9889 Other specified postprocedural states: Secondary | ICD-10-CM | POA: Insufficient documentation

## 2017-11-07 DIAGNOSIS — M25561 Pain in right knee: Secondary | ICD-10-CM | POA: Insufficient documentation

## 2017-11-07 NOTE — Progress Notes (Signed)
[name]  Daily Note    Name: Toni AmendDebra Herrington  DOB: 01/02/1966  Referring Physician: Erskine SquibbBoev, Angel, MD  Todays Date: 11/07/2017    Diagnosis:   1. Chronic pain of right knee     2. Chronic pain of left knee     3. Weakness           Subjective:  Reports knees better.  Able to go up and down stairs with only minimal twinge.      Objective:     Treatment: Details   THERAPEUTIC EXERCISE nustep level 4   1000 steps    Back machine  80#  X 30    multihip machine 30 # ext and flexion x 15,  abd 30 # x 15 right and left   promax  Ext  10 # x 8                 Flex  30 # x 12     Hamstring stretch standing  Verbal cues to not bend back   cybex leg press     50#  X 30  Manual and verbal cues to assist with proper quad set.  Also worked on it supine wiwthout hip ext   MANUAL            NEUROMUSCULAR RE-ED            Education:  encourged to do HEP    Assessment:     Kinesiotape effective   Needed manual and verbal cues to correctly perform quad sets properly without hip ext.   Hamstrings significantly stronger than quads with 3/1 ratio.          Plan of Care:1 visits left for the knee.    Give theraband for knee  Thank you for [facility] this patient to [facility].    Kemoni Quesenberry, PT       TIME + FUNCTIONAL REPORTING    Total Minutes of treatment        Total Non-Treatment time(rest, etc)    Total Service Based Min. of Treatment 0   Total Time-Based minutes of treatment 45           Service-Based Procedures/ Modalities    Evaluation (high, moderate, low)    Re-evaluation    Traction, mechanical    Electric stimulation (unattended)    Total service-based billable procedures 0       Time-Based  Procedures / Modalities 45   Aquatic therapy    Electric stimulation (manual)    Iontophoresis    Ultrasound    Physical Performance Test    Therapeutic ex    Neuromuscular reed    Gait training, including stairs    Manual Therapy    Massage       Therapeutic Activities    Fluidotherapy    Paraffin    Self-care home management    Total Time-Based Billable Procedures

## 2017-11-10 ENCOUNTER — Ambulatory Visit: Payer: Medicaid (Managed Care) | Admitting: Rehabilitative and Restorative Service Providers"

## 2017-11-14 ENCOUNTER — Ambulatory Visit: Payer: Medicaid (Managed Care)

## 2017-11-17 ENCOUNTER — Ambulatory Visit: Payer: Medicaid (Managed Care) | Admitting: Rehabilitative and Restorative Service Providers"

## 2017-11-18 ENCOUNTER — Ambulatory Visit: Payer: Medicaid (Managed Care) | Admitting: Rehabilitative and Restorative Service Providers"

## 2017-11-18 DIAGNOSIS — M25562 Pain in left knee: Secondary | ICD-10-CM

## 2017-11-18 DIAGNOSIS — R531 Weakness: Secondary | ICD-10-CM

## 2017-11-18 DIAGNOSIS — Z9889 Other specified postprocedural states: Secondary | ICD-10-CM

## 2017-11-18 DIAGNOSIS — M461 Sacroiliitis, not elsewhere classified: Secondary | ICD-10-CM

## 2017-11-18 DIAGNOSIS — G8929 Other chronic pain: Secondary | ICD-10-CM

## 2017-11-18 DIAGNOSIS — M25561 Pain in right knee: Secondary | ICD-10-CM

## 2017-11-18 NOTE — Progress Notes (Signed)
[facility]  Discharge Note  Knees and back     Name: Bonnie Faulkner  DOB: 04/06/1966  Referring Physician: Natasha Mead, MD/Dr Barrett  Todays Date: 11/18/2017    Diagnosis:   1. Chronic pain of right knee     2. Chronic pain of left knee     3. Weakness     4. History of lumbar laminectomy     5. Sacroiliitis       Pt has received care for 4 visits to date for knees and 10 visits for the back     Reporting period of treatment: back 2/20-3/25  Knees 3/18- 4/12     Attendance: Bonnie Faulkner       Compliance: Good      Subjective:  Reports she can now manage knee pain with exercises and taping.     Can now do stairs.  Only occasional mild minor twinge only.   95% overall improved  Back feels great  90-95% better.  Occasional left sided twinge and can manage with exercises   Objective:     Treatment: Details   THERAPEUTIC EXERCISE nustep level 4   1000 steps     reassessed for discharge        knee Ext red theraband for HEP x15              knee   Flex  Green theraband for HEP x15    Hamstring stretch standing  Verbal cues to not bend back   cybex leg press     50#  X 30  Manual and verbal cues to assist with proper quad set.  Also worked on it supine wiwthout hip ext         oswestry  2% disabled   KOS 92% ability             Knee MMT Left Right   Quadriceps 72.4 68   Hamstrings 43 48         Goal Length Status   Will be independent with a home exercise program  In 3 visits Short Term met   Will be able to isolate VMO during lunges in 3 visits Short Term met   Will report having 50%  Less knee pain during stairs   10 visits met   Will be able to perform 10 sit to stands in 30 sec without arms to improve her ability to get up from low surfaces in 10 visits 10 visits met   Will improve patellar alignment as measured by ability to minisquat to look into low cupboard 10 visits met     Education:  encourged to do HEP    Assessment:  Bonnie Faulkner has met all goals and is independent with self  management for her knees and her back .  She has returned to all previous activity and is independent with a home program.  She no longer requires skilled PT         Plan of Care:   Discharge PT  Thank you for referring this patient to [facility].    Masaye Gatchalian, PT       TIME + FUNCTIONAL REPORTING    Total Minutes of treatment        Total Non-Treatment time(rest, etc)    Total Service Based Min. of Treatment 0   Total Time-Based minutes of treatment 30           Service-Based Procedures/ Modalities  Evaluation (high, moderate, low)    Re-evaluation    Traction, mechanical    Electric stimulation (unattended)    Total service-based billable procedures 0       Time-Based  Procedures / Modalities 30   Aquatic therapy    Electric stimulation (manual)    Iontophoresis    Ultrasound    Physical Performance Test    Therapeutic ex    Neuromuscular reed    Gait training, including stairs    Manual Therapy    Massage    Therapeutic Activities    Fluidotherapy    Paraffin    Self-care home management    Total Time-Based Billable Procedures

## 2017-11-21 ENCOUNTER — Ambulatory Visit: Payer: Medicaid (Managed Care) | Admitting: Rehabilitative and Restorative Service Providers"

## 2017-11-24 ENCOUNTER — Ambulatory Visit: Payer: Medicaid (Managed Care) | Admitting: Rehabilitative and Restorative Service Providers"

## 2017-12-04 ENCOUNTER — Encounter: Payer: Self-pay | Admitting: Sleep Medicine

## 2017-12-04 DIAGNOSIS — G4733 Obstructive sleep apnea (adult) (pediatric): Secondary | ICD-10-CM

## 2017-12-05 MED ORDER — AMPHETAMINE-DEXTROAMPHETAMINE 20 MG PO TABS *I*
ORAL_TABLET | ORAL | 0 refills | Status: DC
Start: 2017-12-05 — End: 2018-01-03

## 2018-01-02 ENCOUNTER — Encounter: Payer: Self-pay | Admitting: Sleep Medicine

## 2018-01-02 DIAGNOSIS — G4733 Obstructive sleep apnea (adult) (pediatric): Secondary | ICD-10-CM

## 2018-01-03 MED ORDER — AMPHETAMINE-DEXTROAMPHETAMINE 20 MG PO TABS *I*
ORAL_TABLET | ORAL | 0 refills | Status: DC
Start: 2018-01-03 — End: 2018-01-05

## 2018-01-05 MED ORDER — AMPHETAMINE-DEXTROAMPHETAMINE 20 MG PO TABS *I*
20.0000 mg | ORAL_TABLET | Freq: Two times a day (BID) | ORAL | 0 refills | Status: DC
Start: 2018-01-05 — End: 2018-03-06

## 2018-01-05 NOTE — Addendum Note (Signed)
Addended byDonivan Scull on: 01/05/2018 03:54 PM     Modules accepted: Orders

## 2018-02-01 ENCOUNTER — Encounter: Payer: Self-pay | Admitting: Sleep Medicine

## 2018-02-06 ENCOUNTER — Telehealth: Payer: Self-pay

## 2018-02-06 NOTE — Telephone Encounter (Signed)
Bonnie Faulkner from [employer] contacted and stated that the prior authorization for the generic adderall has been approved. Letter to follow with information also.

## 2018-03-03 ENCOUNTER — Encounter: Payer: Self-pay | Admitting: Sleep Medicine

## 2018-03-03 DIAGNOSIS — G4733 Obstructive sleep apnea (adult) (pediatric): Secondary | ICD-10-CM

## 2018-03-06 MED ORDER — AMPHETAMINE-DEXTROAMPHETAMINE 20 MG PO TABS *I*
20.0000 mg | ORAL_TABLET | Freq: Two times a day (BID) | ORAL | 0 refills | Status: DC
Start: 2018-03-06 — End: 2018-04-04

## 2018-04-03 ENCOUNTER — Encounter: Payer: Self-pay | Admitting: Sleep Medicine

## 2018-04-03 DIAGNOSIS — G4733 Obstructive sleep apnea (adult) (pediatric): Secondary | ICD-10-CM

## 2018-04-04 MED ORDER — AMPHETAMINE-DEXTROAMPHETAMINE 20 MG PO TABS *I*
20.0000 mg | ORAL_TABLET | Freq: Two times a day (BID) | ORAL | 0 refills | Status: DC
Start: 2018-04-04 — End: 2018-05-01

## 2018-04-15 ENCOUNTER — Other Ambulatory Visit
Admission: RE | Admit: 2018-04-15 | Discharge: 2018-04-15 | Disposition: A | Discharge status: Home / Self Care | Payer: Medicaid (Managed Care) | Source: Ambulatory Visit | Attending: Internal Medicine | Admitting: Internal Medicine

## 2018-04-15 DIAGNOSIS — R7301 Impaired fasting glucose: Secondary | ICD-10-CM | POA: Insufficient documentation

## 2018-04-15 DIAGNOSIS — Z0000 Encounter for general adult medical examination without abnormal findings: Secondary | ICD-10-CM | POA: Insufficient documentation

## 2018-04-15 LAB — CBC AND DIFFERENTIAL
Baso # K/uL: 0.1 10*3/uL (ref 0.0–0.1)
Basophil %: 0.8 %
Eos # K/uL: 0.3 10*3/uL (ref 0.0–0.4)
Eosinophil %: 2.6 %
Hematocrit: 42 % (ref 34–45)
Hemoglobin: 13.8 g/dL (ref 11.2–15.7)
IMM Granulocytes #: 0 10*3/uL
IMM Granulocytes: 0.3 %
Lymph # K/uL: 2.6 10*3/uL (ref 1.2–3.7)
Lymphocyte %: 25.5 %
MCH: 32 pg/cell (ref 26–32)
MCHC: 33 g/dL (ref 32–36)
MCV: 96 fL — ABNORMAL HIGH (ref 79–95)
Mono # K/uL: 1.1 10*3/uL — ABNORMAL HIGH (ref 0.2–0.9)
Monocyte %: 10.3 %
Neut # K/uL: 6.2 10*3/uL — ABNORMAL HIGH (ref 1.6–6.1)
Nucl RBC # K/uL: 0 10*3/uL (ref 0.0–0.0)
Nucl RBC %: 0 /100 WBC (ref 0.0–0.2)
Platelets: 247 10*3/uL (ref 160–370)
RBC: 4.3 MIL/uL (ref 3.9–5.2)
RDW: 12.6 % (ref 11.7–14.4)
Seg Neut %: 60.5 %
WBC: 10.3 10*3/uL — ABNORMAL HIGH (ref 4.0–10.0)

## 2018-04-15 LAB — COMPREHENSIVE METABOLIC PANEL
ALT: 14 U/L (ref 0–35)
AST: 14 U/L (ref 0–35)
Albumin: 4.2 g/dL (ref 3.5–5.2)
Alk Phos: 77 U/L (ref 35–105)
Anion Gap: 12 (ref 7–16)
Bilirubin,Total: 0.3 mg/dL (ref 0.0–1.2)
CO2: 23 mmol/L (ref 20–28)
Calcium: 9.2 mg/dL (ref 8.6–10.2)
Chloride: 105 mmol/L (ref 96–108)
Creatinine: 0.68 mg/dL (ref 0.51–0.95)
GFR,Black: 116 *
GFR,Caucasian: 100 *
Glucose: 90 mg/dL (ref 60–99)
Lab: 15 mg/dL (ref 6–20)
Potassium: 4.3 mmol/L (ref 3.3–5.1)
Sodium: 140 mmol/L (ref 133–145)
Total Protein: 6.5 g/dL (ref 6.3–7.7)

## 2018-04-15 LAB — LIPID PANEL
Chol/HDL Ratio: 2.4
Cholesterol: 172 mg/dL
HDL: 73 mg/dL
LDL Calculated: 83 mg/dL
Non HDL Cholesterol: 99 mg/dL
Triglycerides: 82 mg/dL

## 2018-04-15 LAB — TSH: TSH: 2.16 u[IU]/mL (ref 0.27–4.20)

## 2018-04-15 LAB — HEMOGLOBIN A1C: Hemoglobin A1C: 5.6 %

## 2018-05-01 ENCOUNTER — Other Ambulatory Visit: Payer: Medicaid (Managed Care)

## 2018-05-01 ENCOUNTER — Ambulatory Visit: Payer: Medicaid (Managed Care) | Attending: Sleep Medicine | Admitting: Sleep Medicine

## 2018-05-01 ENCOUNTER — Ambulatory Visit: Payer: Medicaid (Managed Care) | Admitting: Radiology

## 2018-05-01 ENCOUNTER — Encounter: Payer: Self-pay | Admitting: Sleep Medicine

## 2018-05-01 VITALS — BP 138/79 | HR 84 | Ht 65.0 in | Wt 205.0 lb

## 2018-05-01 DIAGNOSIS — G4733 Obstructive sleep apnea (adult) (pediatric): Secondary | ICD-10-CM | POA: Insufficient documentation

## 2018-05-01 MED ORDER — AMPHETAMINE-DEXTROAMPHETAMINE 20 MG PO CP24 *I*
20.0000 mg | ORAL_CAPSULE | Freq: Every morning | ORAL | 0 refills | Status: DC
Start: 2018-05-01 — End: 2018-06-05

## 2018-05-01 MED ORDER — AMPHETAMINE-DEXTROAMPHETAMINE 20 MG PO TABS *I*
20.0000 mg | ORAL_TABLET | Freq: Every day | ORAL | 0 refills | Status: DC | PRN
Start: 2018-05-01 — End: 2018-06-05

## 2018-05-01 NOTE — Progress Notes (Signed)
[facility]  -  Sleep Center   Dr. Marlowe Sax , M.D. - Follow Up Visit    Bonnie Faulkner  [id number]    Dear Gus Rankin, MD ,    Bonnie Faulkner   Comes for follow-up of moderate obstructive sleep apnea and residual hypersomnia.    I last saw HER-2 years ago, but she stated in touch with my APP staff.    She continues to use CPAP nightly, but doesn't like it.  She tells me the nasal pillows are uncomfortable, she doesn't like the hose.  She has managed to lose 20 pounds in the last 2 years, in part due to appetite suppression from Adderall.    She did not bring her CPAP machine for our review today, but a remote it into the unit.  She is averaging over 8 hours of use at night.  Calculated AHI is about 1 event per hour.  Reasonable air leak.    She is taking 40 mg of the Adderall, usually spaced out 3 times a day.  She is not having any side effects to it.  Specifically denies anxiety and tachycardia.  We did measure hypertension our office a few years ago, but today's blood pressure is close to reasonable, and she tells me her other readings from other outside centers have basically been normal.    She tells me the Adderall works better than armodafinil and she is able to function, but she still thinks there is room for improvement.    She still pursuing disability.  She tells me she has a hearing in the next couple of months, maybe lasts.    She is gone through menopause since last seeing her.  She was getting hot flashes, but estrogen replacement has helped.  She also had a cyst removed surgically from her spine and this has helped with pain control.  She is off gabapentin.    She's not taking much in the way of sedating medications.  She has a prescription for alprazolam which she hasn't filled in months.                    ACTIVE PROBLEM LIST     Patient Active Problem List   Diagnosis Code    Moderate obstructive sleep apnea G47.33    Idiopathic hypersomnia G47.11       MEDICATIONS     Current  Outpatient Prescriptions on File Prior to Visit   Medication Sig Dispense Refill    valACYclovir (VALTREX) 500 MG tablet Take 500 mg by mouth 2 times daily      meloxicam (MOBIC) 15 MG tablet Take 1 tablet (15 mg total) by mouth daily   Take with food. 30 tablet 2    Escitalopram Oxalate (LEXAPRO PO) Take by mouth      multi-vitamin (MULTIVITAMIN) per tablet Take 1 tablet by mouth daily      calcium-vitamin D (OSCAL-500) 500-200 MG-UNIT per tablet Take 1 tablet by mouth 2 times daily      fluticasone (FLONASE) 50 MCG/ACT nasal spray 1 spray by Nasal route daily      ALPRAZolam (XANAX PO) Take by mouth as needed      valGANciclovir (VALCYTE) 450 MG tablet Take 900 mg by mouth daily Swallow whole. Take with food.      SUMAtriptan (IMITREX) 20 MG/ACT nasal spray 1 spray by Nasal route as needed for Migraine   Inhale into one nostril at onset of headache. May repeat once in 2  hours.      Misc. Devices (CPAP) machine Please issue any necessary supplies for CPAP.  Dx:OSA  Length:life 1 each 0     No current facility-administered medications on file prior to visit.        PHYSICAL EXAM     Vitals:    05/01/18 1436   BP: 138/79   Pulse: 84   Weight: 93 kg (205 lb)   Height: 1.651 m (5' 5" )       IMPRESSION     51 year old woman comes for follow-up.  1.  Sleep disordered breathing.  The patient is having some technical concerns with CPAP specifically hose intolerance and nasal pillow intolerance.  Nonetheless, she is using it effectively.  She tells me she wishes it helped her with her alertness more.  2.  Hypersomnia.  Ongoing, but controlled with immediate release Adderall total of 40 mg a day.  I think we should try switched to extended release.      PLAN     1.  I recommended she pursue new nasal pillows through her DME company.  We discussed other treatment options for sleep apnea such as hypoglossal nerve stimulation, and she does not wish to pursue.  2.  I encouraged further efforts at weight loss.  3.  I  prescribed 20 mg of extended release Adderall in the morning, and another 10-20 mg of IR in the afternoon as needed.  4.  She'll contact us via email in the next month with an update.  Follow-up in clinic in about a year.      Thank you for allowing me to participate in this patient's care.  Please do not hesitate to contact me with questions or concerns.  The patient was seen for 35 minutes, and over 50% of this was spent in counseling and coordination of care.  The patient had no further questions at the end of our encounter.        Sincerely,     Stann Ore, MD  [place]    *This note was dictated using Wall 10.1, reasonable efforts were made to correct for any dictation errors.    This document was electronically signed by Stann Ore, MD on 05/01/2018 at 3:22 PM.

## 2018-05-02 ENCOUNTER — Telehealth: Payer: Self-pay

## 2018-05-02 NOTE — Telephone Encounter (Signed)
[employer] is calling to advise that the patients authorization for medication amphetamine-dextroamphetamine (ADDERALL) 20 MG tablet was approved.    The authorization number is: [id number]  The duration of the authorization ranges from: 05/02/2018-05/03/2019.     [employer] can be reached if necessary at [phone].

## 2018-05-05 ENCOUNTER — Other Ambulatory Visit: Payer: Self-pay | Admitting: Internal Medicine

## 2018-05-05 ENCOUNTER — Ambulatory Visit
Admission: RE | Admit: 2018-05-05 | Discharge: 2018-05-05 | Disposition: A | Discharge status: Home / Self Care | Payer: Medicaid (Managed Care) | Source: Ambulatory Visit | Attending: Internal Medicine | Admitting: Internal Medicine

## 2018-05-05 ENCOUNTER — Ambulatory Visit
Admission: RE | Admit: 2018-05-05 | Discharge: 2018-05-05 | Disposition: A | Discharge status: Home / Self Care | Payer: Medicaid (Managed Care) | Source: Ambulatory Visit

## 2018-05-05 ENCOUNTER — Encounter: Payer: Self-pay | Admitting: Radiology

## 2018-05-05 DIAGNOSIS — N6312 Unspecified lump in the right breast, upper inner quadrant: Secondary | ICD-10-CM

## 2018-05-05 DIAGNOSIS — R928 Other abnormal and inconclusive findings on diagnostic imaging of breast: Secondary | ICD-10-CM

## 2018-05-05 DIAGNOSIS — N6311 Unspecified lump in the right breast, upper outer quadrant: Secondary | ICD-10-CM | POA: Insufficient documentation

## 2018-05-05 DIAGNOSIS — N6310 Unspecified lump in the right breast, unspecified quadrant: Secondary | ICD-10-CM

## 2018-05-05 DIAGNOSIS — Z1231 Encounter for screening mammogram for malignant neoplasm of breast: Secondary | ICD-10-CM | POA: Insufficient documentation

## 2018-05-05 DIAGNOSIS — Z1239 Encounter for other screening for malignant neoplasm of breast: Secondary | ICD-10-CM

## 2018-06-05 ENCOUNTER — Encounter: Payer: Self-pay | Admitting: Sleep Medicine

## 2018-06-05 DIAGNOSIS — G4733 Obstructive sleep apnea (adult) (pediatric): Secondary | ICD-10-CM

## 2018-06-05 MED ORDER — AMPHETAMINE-DEXTROAMPHETAMINE 20 MG PO TABS *I*
20.0000 mg | ORAL_TABLET | Freq: Every day | ORAL | 0 refills | Status: DC | PRN
Start: 2018-06-05 — End: 2018-07-03

## 2018-06-05 MED ORDER — AMPHETAMINE-DEXTROAMPHETAMINE 20 MG PO CP24 *I*
20.0000 mg | ORAL_CAPSULE | Freq: Every morning | ORAL | 0 refills | Status: DC
Start: 2018-06-05 — End: 2018-07-03

## 2018-06-12 ENCOUNTER — Encounter: Payer: Self-pay | Admitting: Sleep Medicine

## 2018-07-02 ENCOUNTER — Encounter: Payer: Self-pay | Admitting: Sleep Medicine

## 2018-07-02 DIAGNOSIS — G4733 Obstructive sleep apnea (adult) (pediatric): Secondary | ICD-10-CM

## 2018-07-03 MED ORDER — AMPHETAMINE-DEXTROAMPHETAMINE 20 MG PO TABS *I*
20.0000 mg | ORAL_TABLET | Freq: Every day | ORAL | 0 refills | Status: DC | PRN
Start: 2018-07-03 — End: 2018-07-31

## 2018-07-03 MED ORDER — AMPHETAMINE-DEXTROAMPHETAMINE 20 MG PO CP24 *I*
20.0000 mg | ORAL_CAPSULE | Freq: Every morning | ORAL | 0 refills | Status: DC
Start: 2018-07-03 — End: 2018-07-31

## 2018-07-30 ENCOUNTER — Encounter: Payer: Self-pay | Admitting: Sleep Medicine

## 2018-07-30 DIAGNOSIS — G4733 Obstructive sleep apnea (adult) (pediatric): Secondary | ICD-10-CM

## 2018-07-31 MED ORDER — AMPHETAMINE-DEXTROAMPHETAMINE 20 MG PO CP24 *I*
20.0000 mg | ORAL_CAPSULE | Freq: Every morning | ORAL | 0 refills | Status: DC
Start: 2018-07-31 — End: 2018-08-29

## 2018-07-31 MED ORDER — AMPHETAMINE-DEXTROAMPHETAMINE 20 MG PO TABS *I*
20.0000 mg | ORAL_TABLET | Freq: Every day | ORAL | 0 refills | Status: DC | PRN
Start: 2018-07-31 — End: 2018-08-29

## 2018-08-29 ENCOUNTER — Encounter: Payer: Self-pay | Admitting: Sleep Medicine

## 2018-08-29 DIAGNOSIS — G4733 Obstructive sleep apnea (adult) (pediatric): Secondary | ICD-10-CM

## 2018-08-29 MED ORDER — AMPHETAMINE-DEXTROAMPHETAMINE 20 MG PO TABS *I*
20.0000 mg | ORAL_TABLET | Freq: Every day | ORAL | 0 refills | Status: DC | PRN
Start: 2018-08-29 — End: 2018-09-28

## 2018-08-29 MED ORDER — AMPHETAMINE-DEXTROAMPHETAMINE 20 MG PO CP24 *I*
20.0000 mg | ORAL_CAPSULE | Freq: Every morning | ORAL | 0 refills | Status: DC
Start: 2018-08-29 — End: 2018-09-28

## 2018-09-27 ENCOUNTER — Encounter: Payer: Self-pay | Admitting: Sleep Medicine

## 2018-09-27 DIAGNOSIS — G4733 Obstructive sleep apnea (adult) (pediatric): Secondary | ICD-10-CM

## 2018-10-02 MED ORDER — AMPHETAMINE-DEXTROAMPHETAMINE 20 MG PO TABS *I*
20.0000 mg | ORAL_TABLET | Freq: Every day | ORAL | 0 refills | Status: DC | PRN
Start: 2018-10-02 — End: 2018-10-30

## 2018-10-02 MED ORDER — AMPHETAMINE-DEXTROAMPHETAMINE 20 MG PO CP24 *I*
20.0000 mg | ORAL_CAPSULE | Freq: Every morning | ORAL | 0 refills | Status: DC
Start: 2018-10-02 — End: 2018-10-30

## 2018-10-19 ENCOUNTER — Other Ambulatory Visit
Admission: RE | Admit: 2018-10-19 | Discharge: 2018-10-19 | Disposition: A | Discharge status: Home / Self Care | Payer: Medicare (Managed Care) | Source: Ambulatory Visit | Attending: Internal Medicine | Admitting: Internal Medicine

## 2018-10-19 DIAGNOSIS — E7800 Pure hypercholesterolemia, unspecified: Secondary | ICD-10-CM | POA: Insufficient documentation

## 2018-10-19 DIAGNOSIS — R7301 Impaired fasting glucose: Secondary | ICD-10-CM | POA: Insufficient documentation

## 2018-10-19 LAB — LIPID PANEL
Chol/HDL Ratio: 2.6
Cholesterol: 177 mg/dL
HDL: 69 mg/dL — ABNORMAL HIGH (ref 40–60)
LDL Calculated: 92 mg/dL
Non HDL Cholesterol: 108 mg/dL
Triglycerides: 82 mg/dL

## 2018-10-19 LAB — COMPREHENSIVE METABOLIC PANEL
ALT: 17 U/L (ref 0–35)
AST: 17 U/L (ref 0–35)
Albumin: 4.1 g/dL (ref 3.5–5.2)
Alk Phos: 65 U/L (ref 35–105)
Anion Gap: 9 (ref 7–16)
Bilirubin,Total: 0.5 mg/dL (ref 0.0–1.2)
CO2: 24 mmol/L (ref 20–28)
Calcium: 9.1 mg/dL (ref 8.6–10.2)
Chloride: 106 mmol/L (ref 96–108)
Creatinine: 0.72 mg/dL (ref 0.51–0.95)
GFR,Black: 110 *
GFR,Caucasian: 96 *
Glucose: 98 mg/dL (ref 60–99)
Lab: 17 mg/dL (ref 6–20)
Potassium: 4.1 mmol/L (ref 3.3–5.1)
Sodium: 139 mmol/L (ref 133–145)
Total Protein: 6.6 g/dL (ref 6.3–7.7)

## 2018-10-19 LAB — HEMOGLOBIN A1C: Hemoglobin A1C: 5.6 %

## 2018-10-30 ENCOUNTER — Encounter: Payer: Self-pay | Admitting: Sleep Medicine

## 2018-10-30 DIAGNOSIS — G4733 Obstructive sleep apnea (adult) (pediatric): Secondary | ICD-10-CM

## 2018-10-30 MED ORDER — AMPHETAMINE-DEXTROAMPHETAMINE 20 MG PO TABS *I*
20.0000 mg | ORAL_TABLET | Freq: Every day | ORAL | 0 refills | Status: DC | PRN
Start: 2018-10-30 — End: 2018-11-28

## 2018-10-30 MED ORDER — AMPHETAMINE-DEXTROAMPHETAMINE 20 MG PO CP24 *I*
20.0000 mg | ORAL_CAPSULE | Freq: Every morning | ORAL | 0 refills | Status: DC
Start: 2018-10-30 — End: 2018-11-28

## 2018-11-27 ENCOUNTER — Encounter: Payer: Self-pay | Admitting: Sleep Medicine

## 2018-11-27 DIAGNOSIS — G4733 Obstructive sleep apnea (adult) (pediatric): Secondary | ICD-10-CM

## 2018-11-28 MED ORDER — AMPHETAMINE-DEXTROAMPHETAMINE 20 MG PO TABS *I*
20.0000 mg | ORAL_TABLET | Freq: Every day | ORAL | 0 refills | Status: DC | PRN
Start: 2018-11-28 — End: 2018-12-26

## 2018-11-28 MED ORDER — AMPHETAMINE-DEXTROAMPHETAMINE 20 MG PO CP24 *I*
20.0000 mg | ORAL_CAPSULE | Freq: Every morning | ORAL | 0 refills | Status: DC
Start: 2018-11-28 — End: 2018-12-26

## 2018-12-26 ENCOUNTER — Encounter: Payer: Self-pay | Admitting: Sleep Medicine

## 2018-12-26 DIAGNOSIS — G4733 Obstructive sleep apnea (adult) (pediatric): Secondary | ICD-10-CM

## 2018-12-26 MED ORDER — AMPHETAMINE-DEXTROAMPHETAMINE 20 MG PO TABS *I*
20.0000 mg | ORAL_TABLET | Freq: Every day | ORAL | 0 refills | Status: DC | PRN
Start: 2018-12-26 — End: 2019-01-27

## 2018-12-26 MED ORDER — AMPHETAMINE-DEXTROAMPHETAMINE 20 MG PO CP24 *I*
20.0000 mg | ORAL_CAPSULE | Freq: Every morning | ORAL | 0 refills | Status: DC
Start: 2018-12-26 — End: 2019-01-27

## 2019-01-27 ENCOUNTER — Other Ambulatory Visit: Payer: Self-pay | Admitting: Sleep Medicine

## 2019-01-27 DIAGNOSIS — G4733 Obstructive sleep apnea (adult) (pediatric): Secondary | ICD-10-CM

## 2019-01-29 MED ORDER — AMPHETAMINE-DEXTROAMPHETAMINE 20 MG PO CP24 *I*
20.0000 mg | ORAL_CAPSULE | Freq: Every morning | ORAL | 0 refills | Status: DC
Start: 2019-01-29 — End: 2019-02-26

## 2019-01-29 MED ORDER — AMPHETAMINE-DEXTROAMPHETAMINE 20 MG PO TABS *I*
20.0000 mg | ORAL_TABLET | Freq: Every day | ORAL | 0 refills | Status: DC | PRN
Start: 2019-01-29 — End: 2019-02-26

## 2019-02-25 ENCOUNTER — Encounter: Payer: Self-pay | Admitting: Sleep Medicine

## 2019-02-25 DIAGNOSIS — G4733 Obstructive sleep apnea (adult) (pediatric): Secondary | ICD-10-CM

## 2019-02-26 MED ORDER — AMPHETAMINE-DEXTROAMPHETAMINE 20 MG PO TABS *I*
20.0000 mg | ORAL_TABLET | Freq: Every day | ORAL | 0 refills | Status: DC | PRN
Start: 2019-02-26 — End: 2019-04-02

## 2019-02-26 MED ORDER — AMPHETAMINE-DEXTROAMPHETAMINE 20 MG PO CP24 *I*
20.0000 mg | ORAL_CAPSULE | Freq: Every morning | ORAL | 0 refills | Status: DC
Start: 2019-02-26 — End: 2019-04-02

## 2019-02-27 ENCOUNTER — Telehealth: Payer: Self-pay

## 2019-02-27 NOTE — Telephone Encounter (Signed)
Copied from [place] [id number]. Topic: Medications/Prescriptions - Medication Question/Problem  >> Feb 27, 2019  8:23 AM [place] Main wrote:  [facility] contacted and they received prescriptions for the .Adderall and the Adderal XR. Inquiring if they both should be filled. Also on the regular Aderall it needs a prior authorization. They were able to fill the XR.    Please contact at [phone] ext [phone]

## 2019-02-27 NOTE — Telephone Encounter (Signed)
Spoke with [facility] representative to confirm that Saraiya's stimulant dose was appropriate. They states that the IR Adderall 20 mg required a prior authorization and request that I call [phone] to initiate the claim.  Number contacted, I was unable to initiate the claim as I did not have the requested Tax ID number.  Covermymeds has already been started and additional information was sent to plan today.

## 2019-03-01 ENCOUNTER — Encounter: Payer: Self-pay | Admitting: Sleep Medicine

## 2019-04-02 ENCOUNTER — Encounter: Payer: Self-pay | Admitting: Sleep Medicine

## 2019-04-02 DIAGNOSIS — G4733 Obstructive sleep apnea (adult) (pediatric): Secondary | ICD-10-CM

## 2019-04-02 MED ORDER — AMPHETAMINE-DEXTROAMPHETAMINE 20 MG PO TABS *I*
20.0000 mg | ORAL_TABLET | Freq: Every day | ORAL | 0 refills | Status: DC | PRN
Start: 2019-04-02 — End: 2019-04-02

## 2019-04-02 MED ORDER — AMPHETAMINE-DEXTROAMPHETAMINE 20 MG PO CP24 *I*
20.0000 mg | ORAL_CAPSULE | Freq: Every morning | ORAL | 0 refills | Status: DC
Start: 2019-04-02 — End: 2019-04-02

## 2019-04-02 MED ORDER — AMPHETAMINE-DEXTROAMPHETAMINE 20 MG PO TABS *I*
20.0000 mg | ORAL_TABLET | Freq: Every day | ORAL | 0 refills | Status: DC | PRN
Start: 2019-04-02 — End: 2019-05-01

## 2019-04-02 MED ORDER — AMPHETAMINE-DEXTROAMPHETAMINE 20 MG PO CP24 *I*
20.0000 mg | ORAL_CAPSULE | Freq: Every morning | ORAL | 0 refills | Status: DC
Start: 2019-04-02 — End: 2019-05-01

## 2019-04-02 NOTE — Addendum Note (Signed)
Addended byMarlaine Hind on: 04/02/2019 01:53 PM     Modules accepted: Orders

## 2019-04-02 NOTE — Telephone Encounter (Signed)
Requested prescriptions initially sent to the previously requested pharmacy, [facility].  Pharmacy contacted and notified that it was sent in error.  Prescriptions sent to requested pharmacy, [facility] in [place].

## 2019-04-23 ENCOUNTER — Other Ambulatory Visit
Admission: RE | Admit: 2019-04-23 | Discharge: 2019-04-23 | Disposition: A | Discharge status: Home / Self Care | Payer: Medicare (Managed Care) | Source: Ambulatory Visit | Attending: Internal Medicine | Admitting: Internal Medicine

## 2019-04-23 DIAGNOSIS — E7800 Pure hypercholesterolemia, unspecified: Secondary | ICD-10-CM | POA: Insufficient documentation

## 2019-04-23 DIAGNOSIS — R7301 Impaired fasting glucose: Secondary | ICD-10-CM | POA: Insufficient documentation

## 2019-04-23 LAB — COMPREHENSIVE METABOLIC PANEL
ALT: 17 U/L (ref 0–35)
AST: 19 U/L (ref 0–35)
Albumin: 4.4 g/dL (ref 3.5–5.2)
Alk Phos: 65 U/L (ref 35–105)
Anion Gap: 10 (ref 7–16)
Bilirubin,Total: 0.4 mg/dL (ref 0.0–1.2)
CO2: 26 mmol/L (ref 20–28)
Calcium: 9.3 mg/dL (ref 8.6–10.2)
Chloride: 105 mmol/L (ref 96–108)
Creatinine: 0.75 mg/dL (ref 0.51–0.95)
GFR,Black: 105 *
GFR,Caucasian: 91 *
Glucose: 90 mg/dL (ref 60–99)
Lab: 13 mg/dL (ref 6–20)
Potassium: 4.6 mmol/L (ref 3.3–5.1)
Sodium: 141 mmol/L (ref 133–145)
Total Protein: 7.1 g/dL (ref 6.3–7.7)

## 2019-04-23 LAB — LIPID PANEL
Chol/HDL Ratio: 2.5
Cholesterol: 179 mg/dL
HDL: 71 mg/dL — ABNORMAL HIGH (ref 40–60)
LDL Calculated: 93 mg/dL
Non HDL Cholesterol: 108 mg/dL
Triglycerides: 75 mg/dL

## 2019-04-24 LAB — HEMOGLOBIN A1C: Hemoglobin A1C: 5.6 %

## 2019-04-26 ENCOUNTER — Encounter: Payer: Self-pay | Admitting: Gastroenterology

## 2019-05-01 ENCOUNTER — Other Ambulatory Visit: Payer: Self-pay | Admitting: Sleep Medicine

## 2019-05-01 DIAGNOSIS — G4733 Obstructive sleep apnea (adult) (pediatric): Secondary | ICD-10-CM

## 2019-05-01 MED ORDER — AMPHETAMINE-DEXTROAMPHETAMINE 20 MG PO TABS *I*
20.0000 mg | ORAL_TABLET | Freq: Every day | ORAL | 0 refills | Status: DC | PRN
Start: 2019-05-01 — End: 2019-05-28

## 2019-05-01 MED ORDER — AMPHETAMINE-DEXTROAMPHETAMINE 20 MG PO CP24 *I*
20.0000 mg | ORAL_CAPSULE | Freq: Every morning | ORAL | 0 refills | Status: DC
Start: 2019-05-01 — End: 2019-05-28

## 2019-05-03 ENCOUNTER — Telehealth: Payer: Self-pay

## 2019-05-03 ENCOUNTER — Ambulatory Visit: Payer: Medicare (Managed Care) | Attending: Sleep Medicine | Admitting: Sleep Medicine

## 2019-05-03 DIAGNOSIS — G4733 Obstructive sleep apnea (adult) (pediatric): Secondary | ICD-10-CM | POA: Insufficient documentation

## 2019-05-03 DIAGNOSIS — G4719 Other hypersomnia: Secondary | ICD-10-CM

## 2019-05-03 DIAGNOSIS — G4711 Idiopathic hypersomnia with long sleep time: Secondary | ICD-10-CM | POA: Insufficient documentation

## 2019-05-03 NOTE — Patient Instructions (Addendum)
12 practical tips for getting the most out of your CPAP/BPAP treatment     Tip #1: Adjust your humidifier levels seasonally    • You’ll need more moisture during the winter months, so move your humidity levels up (1/2 to 1 level at a time until you’re satisfied).  • During the summer months, when air is warmer and more humid, you can turn down your humidifier levels.    Tip #2: Keep your CPAP at or slightly beneath height of your head, and make sure it is placed on a hard surface.    Condensation may run down into your hose if it’s above your head and you risk having your machine fall on you.    Tip #3: Always use distilled water in your machine.    Tap water generally contains fluoride and other substances that may be harmful to your lungs. Further, the use of non-distilled water can lead to white mineral deposits  accumulating in your water chamber. Imagine the inside of a shower with hard water stains.      Tip #4: If you feel claustrophobic or are having trouble getting used to your mask...    ...practice by holding the mask up to your face without any of the other parts. Once you're comfortable with that, try wearing the mask with the straps. Hold the mask and hose (without the straps) with the hose attached to the CPAP machine at a low pressure setting (turn the ramp feature on). Wear the mask with the straps and with the air pressure machine turned on while awake and use your CPAP for 15 to 20-minute periods while relaxing, watching TV, etc.  After you're comfortable with that, try sleeping with it on.    Tip #5: Although it is normal and usually temporary, some of our patients have trouble falling asleep when they begin treatment. If this is your situation, consider ...  • Using your machine’s “ramp” feature to gradually increase air pressure over time.  • Avoiding caffeine and alcohol before bedtime.  • Exercising regularly.  • Taking a warm bath before bedtime.  • Avoiding going to bed until you’re  tired.      Tip #6: If you experience difficulties tolerating the air pressure...    ... try using your machine’s “ramp” feature. This will allow you to gradually increase air pressure over time.     You can also “re-set the ramp” feature if you are waking up to high pressures during the night.     Tip #7: Once you’ve adjusted to your CPAP machine ...    ... use it consistently - every time you sleep - including for naps.      Tip #8: If you regularly suffer with a dry or stuffy nose ...    ... you should consider increasing the humidifier level on your CPAP device. You may also need to increase the temperature on your tubing to prevent condensation from the increased humidity. Your doctor may also recommend an over-the-counter nasal steroid spray for you or you can swab your nasal passages with nasal saline gel. You may also use a nasal wash, such as a sinus rinse bottle or Neti-pot. Avoid the use of petroleum jelly- based products in your nose.      Tip #9: Spend a few minutes each day cleaning your equipment    For cleaning we suggest using liquid dish detergent and warm water. Avoid using hot water. Always make sure you remove all of   the soap.     Most manufacturers of CPAP masks include cleaning instructions in the packaging as well.     After cleaning your tubes and hoses in warm soapy water and rinsing them thoroughly, you can let them air dry by hanging them over a towel rack or shower rod.    Tip #10: Change your filter once a month, and any time you notice the filter is changing colors.      Tip #11: Try using zinc oxide ointment for soothing minor skin irritations.     Tip #12: If you are experiencing any difficulty adjusting to using CPAP at home, please don’t hesitate to call us at [phone] or contact your sleep physician or PA via MyChart. We have CPAP specialists who can help troubleshoot any issues that may come up.     [facility]:   Drowsy Driving Safety Facts and Tips    Drowsy  driving is a common - and often deadly - danger on our roads. It occurs when you are too tired to remain alert while operating a motor vehicle. As a result you may struggle to stay focused on the road and fail to practice safe driving techniques.     Unfortunately, drowsy driving occurs all too often. The [place] National Highway Traffic Safety Administration (NHTSA) reports that drowsy driving causes more than 100,000 crashes a year. These accidents result in an estimated 40,000 injuries and 1,550 deaths.    Feeling drowsy makes you easily distracted. You may be unable to focus and pay attention. This can lead to increased errors and a decreased ability to detect and correct mistakes. Your thinking can be slowed and your response time delayed. People who are sleepy also tend to be unaware that their performance and alertness are suffering.     Drowsy driving can be as deadly as drunk driving, putting yourself, your passengers and other drivers in danger. Symptoms of fatigue include:  • Eyes closing or going out of focus   • Persistent yawning   • Irritability, restlessness, and impatience   • Wandering or disconnected thoughts   • Inability to remember driving the last few miles   • Drifting between lanes or onto shoulder   • Abnormal speed, tailgating, or failure to obey traffic signs   • Back tension, burning eyes, shallow breathing or inattentiveness     Accidents caused by drowsy driving tend to share some common features. These include:    Time of Day: Accidents often occur late at night or early in the morning during the body’s natural sleep period. Accidents also can occur when sleepiness peaks after lunch in the middle of the afternoon.    Speed: Accidents often occur at high speeds on highways and other major roadways.    Solitude: Accidents often involve one vehicle that veers off the road. Drivers often are alone in the vehicle.    Driver Behavior - In many cases, drivers who are drowsy make no effort to  brake or avoid an accident. Oftentimes, at least one vehicle may veer off the road.    Severity: Accidents often cause severe injuries or death.    Anyone who doesn’t get enough sleep can drive drowsy. But certain people have a higher risk than others, including people who: work night shifts or rotating shifts, drive a substantial number of miles each day, have a sleep disorder, drink alcohol, an those prescribed sedating medication.  Do you best to avoid drowsy driving.   Rolling down the windows or turning up the volume on the radio will do little to increase your alertness while driving. These are some better ways to avoid drowsy driving:  • Get a full night of seven to eight hours of sleep before driving  • Avoid driving late at night  • Avoid driving alone  • On a long trip, share the driving with another passenger  • When the signs of fatigue begin to show, get off the road. Take a short nap in a well-lit area. Do not simply stop on the side of the road.   • Use caffeine for a short-term boost  • Take a short nap after consuming caffeine to maximize the effect  • Arrange for someone to give you a ride home after working a late shift

## 2019-05-03 NOTE — Progress Notes (Signed)
Bonnie Faulkner - Telephone Visit BiPAP Follow Up Visit    Patient's Location:  home  Other Attendees:  none  Provider:  Lequita Meadowcroft Marilynne Halsted, PA  Location of Telemedicine Provider:  Home office      Bonnie Faulkner is a 53 y.o. woman with a significant past medical history of anxiety, depression, idiopathic hypersomnia and moderate obstructive sleep apnea. I spoke with her today for annual follow up BiPAP therapy for the treatment of obstructive sleep apnea (OSA) as well as stimulant therapy for idiopathic hypersomnia.  Bonnie Faulkner was last seen in the office 05/01/18.      BiPAP Therapy/Interval Symptoms:    She denies technical concerns with her BiPAP.  She chooses not use the humidifier.     She is taking Adderall XR 20 mg each morning along with 10 mg of IR Adderall.  She takes another dose of 10 mg IR Adderall a few hours later.  She avoids driving due to fear of falling asleep at the wheel.  She does not drive often and denies any accidents or near misses.  She plans very carefully when she drives.    She denies insomnia, palpitations, appetite suppresion or weight loss as a result of Adderall use.  She had a 6 month visit with her PCP last week and her blood pressure was 120's/80's.    She reports her baseline anxiety has been worse since the pandemic but is not specifically worse when she takes her stimulants.  She takes xanax rarely on an as needed basis.  She feels her depression is adequately treated and has discussed this recently with her PCP.  She has been granted disability status since we last saw her which was a long process to complete.      PAP INTERROGATION:     Device: ResMed S9 VPAP Auto   Nights used: 30 out of 30 nights (100%)   Nights used ? 4 hours: 30 out of 30 nights (100%)   Average nightly usage: 9 hours and 59 minutes   Pressure Settings: IPAP 10 cmH2O   EPAP: 6 cmH2O       Estimated residual AHI: 1.1 events/hour of sleep   Central Apnea Index: 0.1 events/hour of sleep   Obstructive Apnea  Index: 0.8 events/hour of sleep   Estimated 95th percentile Leaks: 2.1 L/min     Compliance/therapy data was remotely obtained through [employer] and reviewed with patient.     QUESTIONNAIRE:   EPWORTH SLEEPINESS SCALE    SITUATION CHANCE OF DOZING    Sitting and Reading  3   Watching TV 0   Sitting inactive in a public place (like a theater or meeting) 0   As a passenger in a car for an hour without a break. 2   Lying down in the afternoon when circumstances permit 3   Sitting and talking to someone. 1   Sitting quietly after a lunch (without alcohol) 2   In a car, while stopped for a few minutes in traffic. 2                           TOTAL SCORE: 50    Johns MW. A new method for measuring daytime sleepiness: The Epworth Sleepiness Scale. Sleep 1991; 14(6):540-5.      MEDICATIONS:     Current Outpatient Medications   Medication Sig    amphetamine-dextroamphetamine (ADDERALL XR) 20 MG 24 hr capsule Take 1 capsule (20 mg total)  by mouth every morning Max daily dose: 20 mg May sprinkle contents of capsule on food, do not chew capsule.    amphetamine-dextroamphetamine (ADDERALL) 20 MG tablet Take 1 tablet (20 mg total) by mouth daily as needed Max daily dose: 20 mg    estradiol 0.05 MG/24HR twice-weekly patch PLACE 1 PATCH ONTO THE SKIN AT 0900 ON MONDAY AND THURSDAY    valACYclovir (VALTREX) 500 MG tablet Take 500 mg by mouth 2 times daily    meloxicam (MOBIC) 15 MG tablet Take 1 tablet (15 mg total) by mouth daily   Take with food.    Escitalopram Oxalate (LEXAPRO PO) Take by mouth    multi-vitamin (MULTIVITAMIN) per tablet Take 1 tablet by mouth daily    calcium-vitamin D (OSCAL-500) 500-200 MG-UNIT per tablet Take 1 tablet by mouth 2 times daily    fluticasone (FLONASE) 50 MCG/ACT nasal spray 1 spray by Nasal route daily    ALPRAZolam (XANAX PO) Take by mouth as needed    valGANciclovir (VALCYTE) 450 MG tablet Take 900 mg by mouth daily Swallow whole. Take with food.    SUMAtriptan (IMITREX) 20 MG/ACT  nasal spray 1 spray by Nasal route as needed for Migraine   Inhale into one nostril at onset of headache. May repeat once in 2 hours.    Misc. Devices (CPAP) machine Please issue any necessary supplies for CPAP.  Dx:OSA  Length:life     No current facility-administered medications for this visit.      PAST MEDICAL HISTORY:     Patient Active Problem List   Diagnosis Code    Moderate obstructive sleep apnea G47.33    Idiopathic hypersomnia G47.11     Patient's problem list, allergies, and medications were reviewed and updated as appropriate. Please see the EHR for full details.    IMPRESSION & PLAN:     1.) Moderate Obstructive Sleep Apnea:  2.) Idiopathic Hypersomnia:    Interrogation of Bonnie Faulkner's BiPAP unit reveals excellent compliance with mild air leaks and effective treatment of sleep disordered breathing.     I applauded her adherence to BiPAP and encouraged continued compliance with therapy. We reviewed proper care and maintenance of her BiPAP device and interface. I urged Bonnie Faulkner to contact her DME supplier or our office if she experiences any barriers to continued BiPAP use.     Due to residual daytime sleepiness I have recommended that Bonnie Faulkner increase her Adderall XR to 30 mg each morning.  Bonnie Faulkner would prefer to wait until her next refill to make this change.  Once increased I will touch base with Bonnie Faulkner via Mychart to see if it helps her daytime sleepiness without side effects.      She was encouraged to keep consistent sleep hours and develop a daily schedule of activity.    I counseled her on the dangers of driving while drowsy and discussed safe driving precautions should she become sleepy while driving in the future.    Bonnie Faulkner will return in 6 months for a follow up visit. She was instructed to contact our office with any questions or concerns.     Bonnie Faulkner Chauncy Passy, PA  [facility]    An in-person visit with the patient was not advisable at this time due to COVID-19 concerns. In my  professional opinion, my resulting orders would not have differed if an in-person consultation had been performed.    Consent was previously obtained from the patient to complete this telehealth consult; including the potential  for financial liability. The plan was discussed with the patient and the patient/patient rep demonstrated understanding to the provider's satisfaction.    32 minutes was spent reviewing the EMR and management of this patient.

## 2019-05-03 NOTE — Telephone Encounter (Signed)
Copied from [place] [id number]. Topic: Appointments - Appointment Information  >> May 03, 2019 10:41 AM [place] wrote:  Patient cannot get into zoom for her appointment with [facility]. Please phone at [phone].

## 2019-05-03 NOTE — Telephone Encounter (Signed)
Per telephone visit on 05/03/2019- left voice mail to call back to schedule a cpap follow up visit in 6 months with Amy Zenia Resides

## 2019-05-07 ENCOUNTER — Other Ambulatory Visit: Payer: Self-pay | Admitting: Internal Medicine

## 2019-05-07 ENCOUNTER — Encounter: Payer: Self-pay | Admitting: Sleep Medicine

## 2019-05-07 DIAGNOSIS — Z1231 Encounter for screening mammogram for malignant neoplasm of breast: Secondary | ICD-10-CM

## 2019-05-08 ENCOUNTER — Ambulatory Visit
Admission: RE | Admit: 2019-05-08 | Discharge: 2019-05-08 | Disposition: A | Discharge status: Home / Self Care | Payer: Medicare (Managed Care) | Source: Ambulatory Visit | Attending: Internal Medicine | Admitting: Internal Medicine

## 2019-05-08 ENCOUNTER — Ambulatory Visit
Admission: RE | Admit: 2019-05-08 | Discharge: 2019-05-08 | Disposition: A | Discharge status: Home / Self Care | Payer: Medicare (Managed Care) | Source: Ambulatory Visit

## 2019-05-08 ENCOUNTER — Other Ambulatory Visit: Payer: Self-pay | Admitting: Internal Medicine

## 2019-05-08 DIAGNOSIS — N6341 Unspecified lump in right breast, subareolar: Secondary | ICD-10-CM

## 2019-05-08 DIAGNOSIS — N6311 Unspecified lump in the right breast, upper outer quadrant: Secondary | ICD-10-CM

## 2019-05-08 DIAGNOSIS — Z1231 Encounter for screening mammogram for malignant neoplasm of breast: Secondary | ICD-10-CM | POA: Insufficient documentation

## 2019-05-08 DIAGNOSIS — R928 Other abnormal and inconclusive findings on diagnostic imaging of breast: Secondary | ICD-10-CM

## 2019-05-24 ENCOUNTER — Encounter: Payer: Self-pay | Admitting: Sleep Medicine

## 2019-05-24 DIAGNOSIS — G4733 Obstructive sleep apnea (adult) (pediatric): Secondary | ICD-10-CM

## 2019-05-28 MED ORDER — AMPHETAMINE-DEXTROAMPHETAMINE 30 MG PO CP24 *I*
30.0000 mg | ORAL_CAPSULE | Freq: Every morning | ORAL | 0 refills | Status: DC
Start: 2019-05-28 — End: 2019-06-28

## 2019-05-28 MED ORDER — AMPHETAMINE-DEXTROAMPHETAMINE 20 MG PO TABS *I*
20.0000 mg | ORAL_TABLET | Freq: Every day | ORAL | 0 refills | Status: DC | PRN
Start: 2019-05-28 — End: 2019-06-28

## 2019-06-28 ENCOUNTER — Other Ambulatory Visit: Payer: Self-pay | Admitting: Sleep Medicine

## 2019-06-28 DIAGNOSIS — G4733 Obstructive sleep apnea (adult) (pediatric): Secondary | ICD-10-CM

## 2019-07-01 ENCOUNTER — Encounter: Payer: Self-pay | Admitting: Sleep Medicine

## 2019-07-02 MED ORDER — AMPHETAMINE-DEXTROAMPHETAMINE 20 MG PO TABS *I*
20.0000 mg | ORAL_TABLET | Freq: Every day | ORAL | 0 refills | Status: DC | PRN
Start: 2019-07-02 — End: 2019-07-26

## 2019-07-02 MED ORDER — AMPHETAMINE-DEXTROAMPHETAMINE 30 MG PO CP24 *I*
30.0000 mg | ORAL_CAPSULE | Freq: Every morning | ORAL | 0 refills | Status: DC
Start: 2019-07-02 — End: 2019-07-26

## 2019-07-26 ENCOUNTER — Encounter: Payer: Self-pay | Admitting: Sleep Medicine

## 2019-07-26 DIAGNOSIS — G4733 Obstructive sleep apnea (adult) (pediatric): Secondary | ICD-10-CM

## 2019-07-26 MED ORDER — AMPHETAMINE-DEXTROAMPHETAMINE 30 MG PO CP24 *I*
30.0000 mg | ORAL_CAPSULE | Freq: Every morning | ORAL | 0 refills | Status: DC
Start: 2019-07-26 — End: 2019-08-28

## 2019-07-26 MED ORDER — AMPHETAMINE-DEXTROAMPHETAMINE 20 MG PO TABS *I*
20.0000 mg | ORAL_TABLET | Freq: Every day | ORAL | 0 refills | Status: DC | PRN
Start: 2019-07-26 — End: 2019-08-28

## 2019-08-27 ENCOUNTER — Encounter: Payer: Self-pay | Admitting: Sleep Medicine

## 2019-08-28 ENCOUNTER — Other Ambulatory Visit: Payer: Self-pay

## 2019-08-28 DIAGNOSIS — G4733 Obstructive sleep apnea (adult) (pediatric): Secondary | ICD-10-CM

## 2019-08-28 MED ORDER — AMPHETAMINE-DEXTROAMPHETAMINE 20 MG PO TABS *I*
20.0000 mg | ORAL_TABLET | Freq: Every day | ORAL | 0 refills | Status: DC | PRN
Start: 2019-08-28 — End: 2019-09-27

## 2019-08-28 MED ORDER — AMPHETAMINE-DEXTROAMPHETAMINE 30 MG PO CP24 *I*
30.0000 mg | ORAL_CAPSULE | Freq: Every morning | ORAL | 0 refills | Status: DC
Start: 2019-08-28 — End: 2019-09-27

## 2019-08-28 NOTE — Telephone Encounter (Signed)
Refill request per patient     Last visit: 05/03/19 Bonnie Faulkner  Next visit: 11/05/19 A. Freida Busman

## 2019-08-29 ENCOUNTER — Telehealth: Payer: Self-pay

## 2019-08-29 NOTE — Telephone Encounter (Signed)
Temporary supply of AMPHET/DEXTR 20MG  TAB was sent to patient.

## 2019-09-10 ENCOUNTER — Encounter: Payer: Self-pay | Admitting: Sleep Medicine

## 2019-09-26 ENCOUNTER — Encounter: Payer: Self-pay | Admitting: Sleep Medicine

## 2019-09-26 DIAGNOSIS — G4733 Obstructive sleep apnea (adult) (pediatric): Secondary | ICD-10-CM

## 2019-09-27 MED ORDER — AMPHETAMINE-DEXTROAMPHETAMINE 20 MG PO TABS *I*
20.0000 mg | ORAL_TABLET | Freq: Every day | ORAL | 0 refills | Status: DC | PRN
Start: 2019-09-27 — End: 2019-10-30

## 2019-09-27 MED ORDER — AMPHETAMINE-DEXTROAMPHETAMINE 30 MG PO CP24 *I*
30.0000 mg | ORAL_CAPSULE | Freq: Every morning | ORAL | 0 refills | Status: DC
Start: 2019-09-27 — End: 2019-10-30

## 2019-10-22 ENCOUNTER — Other Ambulatory Visit
Admission: RE | Admit: 2019-10-22 | Discharge: 2019-10-22 | Disposition: A | Discharge status: Home / Self Care | Payer: Medicare (Managed Care) | Source: Ambulatory Visit | Attending: Internal Medicine | Admitting: Internal Medicine

## 2019-10-22 DIAGNOSIS — R7301 Impaired fasting glucose: Secondary | ICD-10-CM | POA: Insufficient documentation

## 2019-10-22 DIAGNOSIS — E7800 Pure hypercholesterolemia, unspecified: Secondary | ICD-10-CM | POA: Insufficient documentation

## 2019-10-22 LAB — COMPREHENSIVE METABOLIC PANEL
ALT: 21 U/L (ref 0–35)
AST: 18 U/L (ref 0–35)
Albumin: 4.3 g/dL (ref 3.5–5.2)
Alk Phos: 72 U/L (ref 35–105)
Anion Gap: 8 (ref 7–16)
Bilirubin,Total: 0.3 mg/dL (ref 0.0–1.2)
CO2: 27 mmol/L (ref 20–28)
Calcium: 9.6 mg/dL (ref 8.6–10.2)
Chloride: 103 mmol/L (ref 96–108)
Creatinine: 0.71 mg/dL (ref 0.51–0.95)
GFR,Black: 112 *
GFR,Caucasian: 97 *
Glucose: 117 mg/dL — ABNORMAL HIGH (ref 60–99)
Lab: 15 mg/dL (ref 6–20)
Potassium: 4.3 mmol/L (ref 3.3–5.1)
Sodium: 138 mmol/L (ref 133–145)
Total Protein: 6.6 g/dL (ref 6.3–7.7)

## 2019-10-22 LAB — LIPID PANEL
Chol/HDL Ratio: 2.7
Cholesterol: 195 mg/dL
HDL: 72 mg/dL — ABNORMAL HIGH (ref 40–60)
LDL Calculated: 100 mg/dL
Non HDL Cholesterol: 123 mg/dL
Triglycerides: 115 mg/dL

## 2019-10-23 LAB — HEMOGLOBIN A1C: Hemoglobin A1C: 5.6 %

## 2019-10-29 ENCOUNTER — Encounter: Payer: Self-pay | Admitting: Sleep Medicine

## 2019-10-30 ENCOUNTER — Other Ambulatory Visit: Payer: Self-pay

## 2019-10-30 DIAGNOSIS — G4733 Obstructive sleep apnea (adult) (pediatric): Secondary | ICD-10-CM

## 2019-10-30 MED ORDER — AMPHETAMINE-DEXTROAMPHETAMINE 30 MG PO CP24 *I*
30.0000 mg | ORAL_CAPSULE | Freq: Every morning | ORAL | 0 refills | Status: DC
Start: 2019-10-30 — End: 2019-11-27

## 2019-10-30 MED ORDER — AMPHETAMINE-DEXTROAMPHETAMINE 20 MG PO TABS *I*
20.0000 mg | ORAL_TABLET | Freq: Every day | ORAL | 0 refills | Status: DC | PRN
Start: 2019-10-30 — End: 2019-11-27

## 2019-10-30 NOTE — Telephone Encounter (Signed)
Patient requesting medication refills    Last visit: 05/03/19 A. Allen  Next visit: 11/05/19 A. Freida Busman

## 2019-11-05 ENCOUNTER — Ambulatory Visit: Payer: Medicare (Managed Care) | Attending: Sleep Medicine | Admitting: Sleep Medicine

## 2019-11-05 DIAGNOSIS — G4711 Idiopathic hypersomnia with long sleep time: Secondary | ICD-10-CM | POA: Insufficient documentation

## 2019-11-05 DIAGNOSIS — G4733 Obstructive sleep apnea (adult) (pediatric): Secondary | ICD-10-CM | POA: Insufficient documentation

## 2019-11-05 NOTE — Progress Notes (Signed)
[facility] - Telephone Visit CPAP Follow Up Visit    Patient's Location:  home  Other Attendees:  none  Provider:  Young Mulvey Chauncy Passy, PA  Location of Telemedicine Provider:  Home office      Bonnie Faulkner is a 54 y.o. lady with a significant past medical history of anxiety, depression, idiopathic hypersomnia and moderate obstructive sleep apnea. I spoke with her today in follow up CPAP therapy for the treatment of obstructive sleep apnea (OSA) and as well as stimulant therapy for idiopathic hypersomnia.    She denies any technical concerns with her CPAP.  She does not use the humidifier and has no issues with dryness.    Her blood pressure 10/24/19 at her PCP appt was 120/80.  She denies unintentional weight loss, increase in anxiety, increased blood pressure, palpitations and insomnia.  At her recent appointment, her PCP increased her escitalopram from 10 mg to 20 mg thinking that her depression could be contributing to her daytime sleepiness.  She has history of depression that has been worse with the isolation of the pandemic.    When I saw [name] 6 months ago, we increased her extended release Adderall from 20 to 30 mg.  She has been taking Adderall XR 30 mg each morning and then she takes Adderall IR 10 mg around 2 pm, and sometimes takes another 10 mg IR between 4-5 pm.   She is not sure how much the increase in the extended release has helped her.  She still has drowsiness especially if she is busy during the day which usually results in her taking a nap.  She remains very careful when she drives to makes sure she is not feeling drowsy.      PAP INTERROGATION:         QUESTIONNAIRE:   EPWORTH SLEEPINESS SCALE    SITUATION CHANCE OF DOZING    Sitting and Reading  3   Watching TV 1   Sitting inactive in a public place (like a theater or meeting) 0   As a passenger in a car for an hour without a break. 1   Lying down in the afternoon when circumstances permit 3   Sitting and talking to someone. 1    Sitting quietly after a lunch (without alcohol) 2   In a car, while stopped for a few minutes in traffic. 0                           TOTAL SCORE: 66     Johns MW. A new method for measuring daytime sleepiness: The Epworth Sleepiness Scale. Sleep 1991; 14(6):540-5.      MEDICATIONS:     Current Outpatient Medications   Medication Sig    amphetamine-dextroamphetamine (ADDERALL XR) 30 MG 24 hr capsule Take 1 capsule (30 mg total) by mouth every morning Max daily dose: 30 mg May sprinkle contents of capsule on food, do not chew capsule.    amphetamine-dextroamphetamine (ADDERALL) 20 MG tablet Take 1 tablet (20 mg total) by mouth daily as needed Max daily dose: 20 mg    B COMPLEX-C-E PO Take by mouth    estradiol 0.05 MG/24HR twice-weekly patch PLACE 1 PATCH ONTO THE SKIN AT 0900 ON MONDAY AND THURSDAY    Escitalopram Oxalate (LEXAPRO PO) Take by mouth    multi-vitamin (MULTIVITAMIN) per tablet Take 1 tablet by mouth daily    calcium-vitamin D (OSCAL-500) 500-200 MG-UNIT per tablet Take 1  tablet by mouth 2 times daily    fluticasone (FLONASE) 50 MCG/ACT nasal spray 1 spray by Nasal route daily    Misc. Devices (CPAP) machine Please issue any necessary supplies for CPAP.  Dx:OSA  Length:life    ALPRAZolam (XANAX PO) Take by mouth as needed    SUMAtriptan (IMITREX) 20 MG/ACT nasal spray 1 spray by Nasal route as needed for Migraine   Inhale into one nostril at onset of headache. May repeat once in 2 hours.     No current facility-administered medications for this visit.     PAST MEDICAL HISTORY:     Patient Active Problem List   Diagnosis Code    Moderate obstructive sleep apnea G47.33    Idiopathic hypersomnia G47.11     Patient's problem list, allergies, and medications were reviewed and updated as appropriate. Please see the EHR for full details.    IMPRESSION & PLAN:       ICD-10-CM ICD-9-CM    1. Moderate obstructive sleep apnea  G47.33 327.23    2. Idiopathic hypersomnia  G47.11 780.54        Interrogation of Kamryn's CPAP unit reveals excellent compliance with no air leaks and effective treatment of sleep disordered breathing. She reports ongoing clinical benefit with CPAP therapy.    I applauded her adherence to CPAP and encouraged continued compliance with therapy. We reviewed proper care and maintenance of her CPAP device and interface.  I advised her to reach out to her CPAP supplier to get an end cap since she does not use the water chamber.   I urged Shaelee to contact her DME supplier or our office if she experiences any barriers to continued CPAP use.     For now, I want to continue [name] on her current stimulant regimen. I am hopeful that optimizing her depression treatment will in turn help her complaints of daytime sleepiness.  She denies side effects of stimulant therapy.    I counseled her on the dangers of driving while drowsy and discussed safe driving precautions should she become sleepy while driving in the future.    Ms. Cabler will return in 6 months for a follow up visit. She was instructed to contact our office with any questions or concerns.     [name], [place]    An in-person visit with the patient was not advisable at this time due to COVID-19 concerns. In my professional opinion, my resulting orders would not have differed if an in-person consultation had been performed.    Consent was previously obtained from the patient to complete this telehealth consult; including the potential for financial liability. The plan was discussed with the patient and the patient/patient rep demonstrated understanding to the provider's satisfaction.    I personally spent 25 minutes on the calendar day of the encounter, including pre and post visit work.

## 2019-11-05 NOTE — Patient Instructions (Signed)
12 practical tips for getting the most out of your CPAP/BPAP treatment     Tip #1: Adjust your humidifier levels seasonally    • You’ll need more moisture during the winter months, so move your humidity levels up (1/2 to 1 level at a time until you’re satisfied).  • During the summer months, when air is warmer and more humid, you can turn down your humidifier levels.    Tip #2: Keep your CPAP at or slightly beneath height of your head, and make sure it is placed on a hard surface.    Condensation may run down into your hose if it’s above your head and you risk having your machine fall on you.    Tip #3: Always use distilled water in your machine.    Tap water generally contains fluoride and other substances that may be harmful to your lungs. Further, the use of non-distilled water can lead to white mineral deposits  accumulating in your water chamber. Imagine the inside of a shower with hard water stains.      Tip #4: If you feel claustrophobic or are having trouble getting used to your mask...    ...practice by holding the mask up to your face without any of the other parts. Once you're comfortable with that, try wearing the mask with the straps. Hold the mask and hose (without the straps) with the hose attached to the CPAP machine at a low pressure setting (turn the ramp feature on). Wear the mask with the straps and with the air pressure machine turned on while awake and use your CPAP for 15 to 20-minute periods while relaxing, watching TV, etc.  After you're comfortable with that, try sleeping with it on.    Tip #5: Although it is normal and usually temporary, some of our patients have trouble falling asleep when they begin treatment. If this is your situation, consider ...  • Using your machine’s “ramp” feature to gradually increase air pressure over time.  • Avoiding caffeine and alcohol before bedtime.  • Exercising regularly.  • Taking a warm bath before bedtime.  • Avoiding going to bed until you’re  tired.      Tip #6: If you experience difficulties tolerating the air pressure...    ... try using your machine’s “ramp” feature. This will allow you to gradually increase air pressure over time.     You can also “re-set the ramp” feature if you are waking up to high pressures during the night.     Tip #7: Once you’ve adjusted to your CPAP machine ...    ... use it consistently - every time you sleep - including for naps.      Tip #8: If you regularly suffer with a dry or stuffy nose ...    ... you should consider increasing the humidifier level on your CPAP device. You may also need to increase the temperature on your tubing to prevent condensation from the increased humidity. Your doctor may also recommend an over-the-counter nasal steroid spray for you or you can swab your nasal passages with nasal saline gel. You may also use a nasal wash, such as a sinus rinse bottle or Neti-pot. Avoid the use of petroleum jelly- based products in your nose.      Tip #9: Spend a few minutes each day cleaning your equipment    For cleaning we suggest using liquid dish detergent and warm water. Avoid using hot water. Always make sure you remove all of   the soap.     Most manufacturers of CPAP masks include cleaning instructions in the packaging as well.     After cleaning your tubes and hoses in warm soapy water and rinsing them thoroughly, you can let them air dry by hanging them over a towel rack or shower rod.    Tip #10: Change your filter once a month, and any time you notice the filter is changing colors.      Tip #11: Try using zinc oxide ointment for soothing minor skin irritations.     Tip #12: If you are experiencing any difficulty adjusting to using CPAP at home, please don’t hesitate to call us at [phone] or contact your sleep physician or PA via MyChart. We have CPAP specialists who can help troubleshoot any issues that may come up.

## 2019-11-06 ENCOUNTER — Telehealth: Payer: Self-pay

## 2019-11-06 NOTE — Telephone Encounter (Signed)
Left message to schedule 6 mth F/U with Dr Lacie Draft per Donivan Scull

## 2019-11-26 ENCOUNTER — Encounter: Payer: Self-pay | Admitting: Sleep Medicine

## 2019-11-26 DIAGNOSIS — G4733 Obstructive sleep apnea (adult) (pediatric): Secondary | ICD-10-CM

## 2019-11-27 MED ORDER — AMPHETAMINE-DEXTROAMPHETAMINE 20 MG PO TABS *I*
20.0000 mg | ORAL_TABLET | Freq: Every day | ORAL | 0 refills | Status: DC | PRN
Start: 2019-11-27 — End: 2019-12-27

## 2019-11-27 MED ORDER — AMPHETAMINE-DEXTROAMPHETAMINE 30 MG PO CP24 *I*
30.0000 mg | ORAL_CAPSULE | Freq: Every morning | ORAL | 0 refills | Status: DC
Start: 2019-11-27 — End: 2019-12-27

## 2019-12-25 ENCOUNTER — Encounter: Payer: Self-pay | Admitting: Sleep Medicine

## 2019-12-25 DIAGNOSIS — G4733 Obstructive sleep apnea (adult) (pediatric): Secondary | ICD-10-CM

## 2019-12-27 MED ORDER — AMPHETAMINE-DEXTROAMPHETAMINE 20 MG PO TABS *I*
20.0000 mg | ORAL_TABLET | Freq: Every day | ORAL | 0 refills | Status: DC | PRN
Start: 2019-12-27 — End: 2020-01-24

## 2019-12-27 MED ORDER — AMPHETAMINE-DEXTROAMPHETAMINE 30 MG PO CP24 *I*
30.0000 mg | ORAL_CAPSULE | Freq: Every morning | ORAL | 0 refills | Status: DC
Start: 2019-12-27 — End: 2020-01-24

## 2020-01-24 ENCOUNTER — Other Ambulatory Visit: Payer: Self-pay

## 2020-01-24 ENCOUNTER — Encounter: Payer: Self-pay | Admitting: Sleep Medicine

## 2020-01-24 DIAGNOSIS — G4733 Obstructive sleep apnea (adult) (pediatric): Secondary | ICD-10-CM

## 2020-01-24 NOTE — Telephone Encounter (Signed)
Last visit: 11/05/19 Bonnie Faulkner  Next visit: None scheduled

## 2020-01-25 MED ORDER — AMPHETAMINE-DEXTROAMPHETAMINE 20 MG PO TABS *I*
20.0000 mg | ORAL_TABLET | Freq: Every day | ORAL | 0 refills | Status: DC | PRN
Start: 2020-01-25 — End: 2020-01-28

## 2020-01-25 MED ORDER — AMPHETAMINE-DEXTROAMPHETAMINE 30 MG PO CP24 *I*
30.0000 mg | ORAL_CAPSULE | Freq: Every morning | ORAL | 0 refills | Status: DC
Start: 2020-01-25 — End: 2020-01-28

## 2020-01-28 MED ORDER — AMPHETAMINE-DEXTROAMPHETAMINE 20 MG PO TABS *I*
20.0000 mg | ORAL_TABLET | Freq: Every day | ORAL | 0 refills | Status: DC | PRN
Start: 2020-01-28 — End: 2020-02-21

## 2020-01-28 MED ORDER — AMPHETAMINE-DEXTROAMPHETAMINE 30 MG PO CP24 *I*
30.0000 mg | ORAL_CAPSULE | Freq: Every morning | ORAL | 0 refills | Status: DC
Start: 2020-01-28 — End: 2020-02-21

## 2020-01-28 NOTE — Telephone Encounter (Signed)
Refill sent for Adderall XR 30 mg and Adderall 20 mg per request.

## 2020-02-08 ENCOUNTER — Emergency Department
Admission: EM | Admit: 2020-02-08 | Discharge: 2020-02-08 | Disposition: A | Discharge status: Home / Self Care | Payer: Medicare (Managed Care) | Source: Ambulatory Visit | Attending: Emergency Medicine | Admitting: Emergency Medicine

## 2020-02-08 ENCOUNTER — Emergency Department: Payer: Medicare (Managed Care)

## 2020-02-08 DIAGNOSIS — N202 Calculus of kidney with calculus of ureter: Secondary | ICD-10-CM

## 2020-02-08 DIAGNOSIS — R1032 Left lower quadrant pain: Secondary | ICD-10-CM

## 2020-02-08 DIAGNOSIS — R112 Nausea with vomiting, unspecified: Secondary | ICD-10-CM

## 2020-02-08 DIAGNOSIS — N201 Calculus of ureter: Secondary | ICD-10-CM

## 2020-02-08 DIAGNOSIS — F17210 Nicotine dependence, cigarettes, uncomplicated: Secondary | ICD-10-CM | POA: Insufficient documentation

## 2020-02-08 DIAGNOSIS — N1330 Unspecified hydronephrosis: Secondary | ICD-10-CM

## 2020-02-08 DIAGNOSIS — N132 Hydronephrosis with renal and ureteral calculous obstruction: Secondary | ICD-10-CM | POA: Insufficient documentation

## 2020-02-08 HISTORY — DX: Anxiety disorder, unspecified: F41.9

## 2020-02-08 HISTORY — DX: Migraine, unspecified, not intractable, without status migrainosus: G43.909

## 2020-02-08 LAB — CBC AND DIFFERENTIAL
Baso # K/uL: 0.1 10*3/uL (ref 0.0–0.1)
Basophil %: 0.5 %
Eos # K/uL: 0.2 10*3/uL (ref 0.0–0.4)
Eosinophil %: 0.8 %
Hematocrit: 46 % — ABNORMAL HIGH (ref 34–45)
Hemoglobin: 15.5 g/dL (ref 11.2–15.7)
IMM Granulocytes #: 0.1 10*3/uL — ABNORMAL HIGH (ref 0.0–0.0)
IMM Granulocytes: 0.5 %
Lymph # K/uL: 2.2 10*3/uL (ref 1.2–3.7)
Lymphocyte %: 9.5 %
MCH: 31 pg (ref 26–32)
MCHC: 34 g/dL (ref 32–36)
MCV: 92 fL (ref 79–95)
Mono # K/uL: 1.4 10*3/uL — ABNORMAL HIGH (ref 0.2–0.9)
Monocyte %: 5.9 %
Neut # K/uL: 19.1 10*3/uL — ABNORMAL HIGH (ref 1.6–6.1)
Nucl RBC # K/uL: 0 10*3/uL (ref 0.0–0.0)
Nucl RBC %: 0 /100 WBC (ref 0.0–0.2)
Platelets: 290 10*3/uL (ref 160–370)
RBC: 5 MIL/uL (ref 3.9–5.2)
RDW: 12.9 % (ref 11.7–14.4)
Seg Neut %: 82.8 %
WBC: 23.1 10*3/uL — ABNORMAL HIGH (ref 4.0–10.0)

## 2020-02-08 LAB — COMPREHENSIVE METABOLIC PANEL
ALT: 17 U/L (ref 0–35)
AST: 26 U/L (ref 0–35)
Albumin: 4.3 g/dL (ref 3.5–5.2)
Alk Phos: 75 U/L (ref 35–105)
Anion Gap: 16 (ref 7–16)
Bilirubin,Total: 0.6 mg/dL (ref 0.0–1.2)
CO2: 18 mmol/L — ABNORMAL LOW (ref 20–28)
Calcium: 9.4 mg/dL (ref 8.6–10.2)
Chloride: 108 mmol/L (ref 96–108)
Creatinine: 0.78 mg/dL (ref 0.51–0.95)
GFR,Black: 99 *
GFR,Caucasian: 86 *
Glucose: 122 mg/dL — ABNORMAL HIGH (ref 60–99)
Lab: 13 mg/dL (ref 6–20)
Potassium: 3.9 mmol/L (ref 3.4–4.7)
Sodium: 142 mmol/L (ref 133–145)
Total Protein: 7.5 g/dL (ref 6.3–7.7)

## 2020-02-08 LAB — URINALYSIS REFLEX TO CULTURE
Glucose,UA: NEGATIVE mg/dL
Leuk Esterase,UA: NEGATIVE
Nitrite,UA: NEGATIVE
Specific Gravity,UA: 1.023 (ref 1.002–1.030)
pH,UA: 5.5 (ref 5.0–8.0)

## 2020-02-08 LAB — URINE MICROSCOPIC (IQ200)

## 2020-02-08 LAB — HM HIV SCREENING OFFERED

## 2020-02-08 MED ORDER — HYDROMORPHONE HCL PF 1 MG/ML IJ SOLN *WRAPPED*
1.0000 mg | Freq: Once | INTRAMUSCULAR | Status: AC
Start: 2020-02-08 — End: 2020-02-08

## 2020-02-08 MED ORDER — SODIUM CHLORIDE 0.9 % IV BOLUS *I*
1000.0000 mL | Freq: Once | Status: AC
Start: 2020-02-08 — End: 2020-02-08
  Administered 2020-02-08: 1000 mL via INTRAVENOUS

## 2020-02-08 MED ORDER — KETOROLAC TROMETHAMINE 30 MG/ML IJ SOLN *I*
30.0000 mg | Freq: Once | INTRAMUSCULAR | Status: AC
Start: 2020-02-08 — End: 2020-02-08
  Administered 2020-02-08: 30 mg via INTRAVENOUS
  Filled 2020-02-08: qty 1

## 2020-02-08 MED ORDER — TRAMADOL HCL 50 MG PO TABS *I*
50.0000 mg | ORAL_TABLET | Freq: Four times a day (QID) | ORAL | 0 refills | Status: DC | PRN
Start: 2020-02-08 — End: 2022-04-21

## 2020-02-08 MED ORDER — SODIUM CHLORIDE 0.9 % 25 ML IV SOLN *I*
12.5000 mg | Freq: Once | INTRAVENOUS | Status: AC
Start: 2020-02-08 — End: 2020-02-08
  Administered 2020-02-08: 12.5 mg via INTRAVENOUS
  Filled 2020-02-08: qty 1

## 2020-02-08 MED ORDER — PROMETHAZINE HCL 25 MG PO TABS *I*
25.0000 mg | ORAL_TABLET | ORAL | 0 refills | Status: DC | PRN
Start: 2020-02-08 — End: 2022-04-21

## 2020-02-08 MED ORDER — HYDROMORPHONE HCL PF 1 MG/ML IJ SOLN *WRAPPED*
INTRAMUSCULAR | Status: AC
Start: 2020-02-08 — End: 2020-02-08
  Administered 2020-02-08: 1 mg via INTRAVENOUS
  Filled 2020-02-08: qty 1

## 2020-02-08 MED ORDER — ONDANSETRON HCL 2 MG/ML IV SOLN *I*
4.0000 mg | Freq: Once | INTRAMUSCULAR | Status: AC
Start: 2020-02-08 — End: 2020-02-08
  Administered 2020-02-08: 4 mg via INTRAVENOUS
  Filled 2020-02-08: qty 2

## 2020-02-08 MED ORDER — FENTANYL CITRATE 50 MCG/ML IJ SOLN *WRAPPED*
50.0000 ug | Freq: Once | INTRAMUSCULAR | Status: AC
Start: 2020-02-08 — End: 2020-02-08
  Administered 2020-02-08: 50 ug via INTRAVENOUS
  Filled 2020-02-08: qty 2

## 2020-02-08 NOTE — ED Triage Notes (Signed)
Pt comes into the ED today by car from  Home for LLQ abd pain 10/10.  Pt states that the abd pain started this morning       Triage Note   Noel Christmas, RN

## 2020-02-08 NOTE — ED Provider Notes (Signed)
History     Chief Complaint   Patient presents with    Abdominal Pain     54 yr old F presents with LLQ pain that began abruptly this morning. She noted some vague discomfort in her L flank over the past few days. Pain is now severe with associated n/v      History provided by:  Patient  Flank Pain  Pain location:  L flank and LLQ  Pain quality: aching and stabbing    Pain radiates to:  Does not radiate  Pain severity:  Severe  Onset quality:  Sudden  Timing:  Constant  Progression:  Worsening  Chronicity:  New  Relieved by:  Nothing  Worsened by:  Nothing  Associated symptoms: nausea and vomiting    Associated symptoms: no chest pain, no chills, no fever and no shortness of breath        Medical/Surgical/Family History     Past Medical History:   Diagnosis Date    Anxiety     Migraines         Patient Active Problem List   Diagnosis Code    Moderate obstructive sleep apnea G47.33    Idiopathic hypersomnia G47.11            Past Surgical History:   Procedure Laterality Date    BACK SURGERY      KNEE ARTHROSCOPY       Family History   Problem Relation Age of Onset    Ovarian cancer Mother 58    Melanoma Brother 38    Breast cancer Neg Hx           Social History     Tobacco Use    Smoking status: Current Every Day Smoker     Types: Cigarettes    Smokeless tobacco: Never Used   Substance Use Topics    Alcohol use: Not on file    Drug use: Not on file     Living Situation     Questions Responses    Patient lives with Alone    Homeless No    Caregiver for other family member No    External Services None    Employment Unemployed    Domestic Violence Risk                 Review of Systems   Review of Systems   Constitutional: Negative for chills and fever.   HENT: Negative for congestion and rhinorrhea.    Respiratory: Negative for chest tightness and shortness of breath.    Cardiovascular: Negative for chest pain.   Gastrointestinal: Positive for abdominal pain, nausea and vomiting.   Genitourinary:  Positive for flank pain.   Musculoskeletal: Negative for back pain and neck pain.   Skin: Negative for rash.   Neurological: Negative for dizziness and weakness.   All other systems reviewed and are negative.      Physical Exam     Triage Vitals  Triage Start: Start, (02/08/20 1403)   First Recorded  ,    .      Physical Exam  Vitals and nursing note reviewed.   Constitutional:       General: She is in acute distress.      Appearance: Normal appearance.   HENT:      Head: Normocephalic and atraumatic.      Right Ear: External ear normal.      Left Ear: External ear normal.      Mouth/Throat:  Mouth: Mucous membranes are moist.   Eyes:      Extraocular Movements: Extraocular movements intact.      Pupils: Pupils are equal, round, and reactive to light.   Cardiovascular:      Rate and Rhythm: Normal rate and regular rhythm.   Pulmonary:      Effort: Pulmonary effort is normal.      Breath sounds: Normal breath sounds.   Abdominal:      Tenderness: There is no right CVA tenderness, left CVA tenderness, guarding or rebound.   Musculoskeletal:         General: Normal range of motion.      Cervical back: Normal range of motion.   Skin:     General: Skin is warm.   Neurological:      General: No focal deficit present.      Mental Status: She is alert and oriented to person, place, and time.   Psychiatric:         Mood and Affect: Mood normal.         Medical Decision Making   Patient seen by me on:  02/08/2020    Assessment:  54 yr old F presents with severe left lower abdominal/flank pain    Differential diagnosis:  Ureterolithiasis, ovarian cyst/torsion, diverticulitis     Plan:  Labs, CT, symptomatic management     Independent review of: Existing labs, CT scans, chart/prior records    ED Course and Disposition:  Labs Reviewed  URINALYSIS REFLEX TO CULTURE - Abnormal; Notable for the following components:     Protein,UA                    Trace (*)               Ketones, UA                   2+ (*)                  Blood,UA                      3+ (*)              All other components within normal limits  CBC AND DIFFERENTIAL - Abnormal; Notable for the following components:     WBC                           23.1 (*)               Hematocrit                    46 (*)                 Neut # K/uL                   19.1 (*)               Mono # K/uL                   1.4 (*)                IMM Granulocytes #            0.1 (*)             All other components within normal limits  COMPREHENSIVE METABOLIC PANEL - Abnormal;  Notable for the following components:     CO2                           18 (*)                 Glucose                       122 (*)             All other components within normal limits  URINE MICROSCOPIC (IQ200) - Abnormal; Notable for the following components:     RBC,UA                        11-20 (*)            All other components within normal limits  UA WITH REFLEX TO CULTURE    CT abdomen and pelvis without contrast   Final Result        Mild LEFT hydroureteronephrosis with a 3 mm distal LEFT ureter calculus.        Additional nonobstructive bilateral renal calculi.        END OF IMPRESSION                [facility] submits this DICOM format image data and final report to the [facility], an independent secure electronic health information exchange, on a reciprocally searchable basis (with patient authorization) for a minimum of 12 months after exam     date.     Pt is pain free x 2 + hours- eager for d/c- given absence of pain with position of stone at time of CT this hopefully indicates passage into the bladder. She will return here with any return or progression of symptoms             Fabio Asa, DO          Fabio Asa, DO  02/09/20 1854

## 2020-02-16 ENCOUNTER — Encounter: Payer: Self-pay | Admitting: Sleep Medicine

## 2020-02-16 DIAGNOSIS — G4733 Obstructive sleep apnea (adult) (pediatric): Secondary | ICD-10-CM

## 2020-02-21 MED ORDER — AMPHETAMINE-DEXTROAMPHETAMINE 30 MG PO CP24 *I*
30.0000 mg | ORAL_CAPSULE | Freq: Every morning | ORAL | 0 refills | Status: DC
Start: 2020-02-21 — End: 2020-03-27

## 2020-02-21 MED ORDER — AMPHETAMINE-DEXTROAMPHETAMINE 20 MG PO TABS *I*
20.0000 mg | ORAL_TABLET | Freq: Every day | ORAL | 0 refills | Status: DC | PRN
Start: 2020-02-21 — End: 2020-03-27

## 2020-03-26 ENCOUNTER — Encounter: Payer: Self-pay | Admitting: Sleep Medicine

## 2020-03-26 DIAGNOSIS — G4733 Obstructive sleep apnea (adult) (pediatric): Secondary | ICD-10-CM

## 2020-03-27 MED ORDER — AMPHETAMINE-DEXTROAMPHETAMINE 30 MG PO CP24 *I*
30.0000 mg | ORAL_CAPSULE | Freq: Every morning | ORAL | 0 refills | Status: DC
Start: 2020-03-27 — End: 2020-04-24

## 2020-03-27 MED ORDER — AMPHETAMINE-DEXTROAMPHETAMINE 20 MG PO TABS *I*
20.0000 mg | ORAL_TABLET | Freq: Every day | ORAL | 0 refills | Status: DC | PRN
Start: 2020-03-27 — End: 2020-04-24

## 2020-04-24 ENCOUNTER — Encounter: Payer: Self-pay | Admitting: Sleep Medicine

## 2020-04-24 ENCOUNTER — Ambulatory Visit: Payer: Medicare (Managed Care) | Attending: Sleep Medicine | Admitting: Sleep Medicine

## 2020-04-24 VITALS — BP 164/78 | HR 85 | Temp 96.9°F | Ht 66.0 in | Wt 219.9 lb

## 2020-04-24 DIAGNOSIS — G4733 Obstructive sleep apnea (adult) (pediatric): Secondary | ICD-10-CM | POA: Insufficient documentation

## 2020-04-24 MED ORDER — AMPHETAMINE-DEXTROAMPHETAMINE 30 MG PO CP24 *I*
30.0000 mg | ORAL_CAPSULE | Freq: Every morning | ORAL | 0 refills | Status: DC
Start: 2020-04-24 — End: 2020-05-26

## 2020-04-24 MED ORDER — AMPHETAMINE-DEXTROAMPHETAMINE 20 MG PO TABS *I*
20.0000 mg | ORAL_TABLET | Freq: Every day | ORAL | 0 refills | Status: DC | PRN
Start: 2020-04-24 — End: 2020-05-26

## 2020-04-24 NOTE — Patient Instructions (Addendum)
Light Therapy        Treatment for a Delayed Sleep Phase:  1.  Get bright light exposure in the morning upon awakening. During the day and especially early in the mornings, light exposure is key. Try and get up out of bed when your alarm clock goes off and open the curtains/blinds to allow as much light as possible into the room, even if you plan on getting back into bed and hitting the snooze button. Consider purchasing a light therapy box, such as the Philips [id number] Wake-Up Light With [employer], available on [employer] for approximately $110 to $130.    Or, another full spectrum light box, with a minimum of 10,000 lux (or 20,000 if you can find it)

## 2020-04-24 NOTE — Progress Notes (Signed)
[facility]   Dr. Valetta Fuller , M.D. - Follow Up Visit    Bonnie Faulkner  [id number]    Dear No ref. provider found ,    Bonnie Faulkner comes for follow-up of obstructive sleep apnea and hypersomnia.    I last saw her about 2 years ago, although she has been in regular contact with my APP staff.    She continues to use CPAP.  She is not having any technical problems, but does not like to use it.  This has been the case for a long time.  She is cleaning her equipment.  She does get replacement supplies as needed.    I interrogated her CPAP machine remotely today.  She is averaging about 6-1/2 hours of use.  Pressure range 5-20, median 8.7, maximum 12.3 cm of water.  Reasonable air leak.  Calculated AHI 0.3 events per hour.  In looking at the day-to-day graphs, the patient almost always starts her using her machine after midnight.    She continues to take extended release Adderall 30 mg in the morning, and another 20 mg in the afternoon.  She always takes a total of 50 mg a day.  She is not falling asleep during the day, but tells me she has low energy.  She doesn't nap, in part because she does not find them to be refreshing.  Her energy levels seem to pick up in the evening time.    Also, although my initial consult from 2017 stated that her sleep times are roughly 11PM-7AM, she tells me today she has preferred to keep late night hours, and has for some time.    She did achieve disability, which apparently kicked in in March 2020.    Her weight is up about 15 pounds compared to 2019.  She is not having any side effects to the Adderall.  She does tell me that her mood is variable.  Anxiety is often in play.  Recently she has been going through a crisis, in part fueled by the pandemic.  She is not suicidal.           Recent Review Flowsheet Data     Epworth Sleep Score 04/24/2020    EPWORTH TOTAL  16            ACTIVE PROBLEM LIST     Patient Active Problem List   Diagnosis Code    Moderate  obstructive sleep apnea G47.33    Idiopathic hypersomnia G47.11       MEDICATIONS     Current Outpatient Medications on File Prior to Visit   Medication Sig Dispense Refill    amphetamine-dextroamphetamine (ADDERALL XR) 30 MG 24 hr capsule Take 1 capsule (30 mg total) by mouth every morning Max daily dose: 30 mg May sprinkle contents of capsule on food, do not chew capsule. 30 capsule 0    amphetamine-dextroamphetamine (ADDERALL) 20 MG tablet Take 1 tablet (20 mg total) by mouth daily as needed Max daily dose: 20 mg 30 tablet 0    traMADol (ULTRAM) 50 MG tablet Take 1 tablet (50 mg total) by mouth every 6 hours as needed for Pain for up to 20 doses Max daily dose: 200 mg 20 tablet 0    promethazine (PHENERGAN) 25 MG tablet Take 1 tablet (25 mg total) by mouth every 4-6 hours as needed for Vomiting for up to 20 doses 20 tablet 0    B COMPLEX-C-E PO Take by mouth  estradiol 0.05 MG/24HR twice-weekly patch PLACE 1 PATCH ONTO THE SKIN AT 0900 ON MONDAY AND THURSDAY  12    Escitalopram Oxalate (LEXAPRO PO) Take by mouth      multi-vitamin (MULTIVITAMIN) per tablet Take 1 tablet by mouth daily      calcium-vitamin D (OSCAL-500) 500-200 MG-UNIT per tablet Take 1 tablet by mouth 2 times daily      fluticasone (FLONASE) 50 MCG/ACT nasal spray 1 spray by Nasal route daily      ALPRAZolam (XANAX PO) Take by mouth as needed      SUMAtriptan (IMITREX) 20 MG/ACT nasal spray 1 spray by Nasal route as needed for Migraine   Inhale into one nostril at onset of headache. May repeat once in 2 hours.      Misc. Devices (CPAP) machine Please issue any necessary supplies for CPAP.  Dx:OSA  Length:life 1 each 0     No current facility-administered medications on file prior to visit.       PHYSICAL EXAM         Vitals:    04/24/20 1455   BP: 164/78   Pulse: 85   Temp: 36.1 C (96.9 F)   Weight: 99.7 kg (219 lb 14.4 oz)   Height: 1.676 m (5\' 6" )       IMPRESSION     54 year old woman comes for follow-up.  1.  Sleep  disordered breathing.  She has been diagnosed with moderate obstructive sleep apnea.  She is using CPAP (and has used BiPAP in the past).  Although she does not like the therapy, she is compliant with it.  2.  Hypersomnolence.  The patient has had residual sleepiness despite adequate treatment for sleep apnea.  She is currently on Adderall extended release and immediate release and is getting some benefit from it.  In fact, I think this stimulant is basically achieving what we would expect.  She continues to have low energy during the day, particularly in the first half of the day.  There might be a circadian component to this.  I think we should try some light therapy, but keep Adderall dosing the same.    PLAN     1. Continue CPAP nightly.  Clean equipment routinely, replace supplies as needed.  2. I recommended light therapy 10000-20000 Lux in the morning for 20 minutes every day.  Try this for 4 to 6 weeks.  3. I refilled her extended release and immediate release Adderall at the above doses.  I reluctant to increase, given hypertension and near maximum dose.  4. Contact via MyChart in late October for follow-up.      November, MD  [facility]        Thank you for allowing me to participate in this patient's care.  Please do not hesitate to contact me with questions or concerns.  I personally spent 30 minutes on the calendar day of the encounter, including pre and post visit work.  The patient had no further questions at the end of our visit.      *This note was dictated using Dragon [employer] 10.  Reasonable efforts were made to correct for any dictation errors.    This document was electronically signed by Fredrik Cove, MD on 04/24/2020 at 3:16 PM.

## 2020-04-28 ENCOUNTER — Other Ambulatory Visit
Admission: RE | Admit: 2020-04-28 | Discharge: 2020-04-28 | Disposition: A | Discharge status: Home / Self Care | Payer: Medicare (Managed Care) | Source: Ambulatory Visit | Attending: Internal Medicine | Admitting: Internal Medicine

## 2020-04-28 DIAGNOSIS — Z0000 Encounter for general adult medical examination without abnormal findings: Secondary | ICD-10-CM | POA: Insufficient documentation

## 2020-04-28 DIAGNOSIS — R7301 Impaired fasting glucose: Secondary | ICD-10-CM | POA: Insufficient documentation

## 2020-04-28 LAB — COMPREHENSIVE METABOLIC PANEL
ALT: 18 U/L (ref 0–35)
AST: 21 U/L (ref 0–35)
Albumin: 4.4 g/dL (ref 3.5–5.2)
Alk Phos: 82 U/L (ref 35–105)
Anion Gap: 12 (ref 7–16)
Bilirubin,Total: 0.2 mg/dL (ref 0.0–1.2)
CO2: 25 mmol/L (ref 20–28)
Calcium: 10.1 mg/dL (ref 8.6–10.2)
Chloride: 103 mmol/L (ref 96–108)
Creatinine: 0.7 mg/dL (ref 0.51–0.95)
GFR,Black: 113 *
GFR,Caucasian: 98 *
Glucose: 87 mg/dL (ref 60–99)
Lab: 12 mg/dL (ref 6–20)
Potassium: 4.2 mmol/L (ref 3.3–5.1)
Sodium: 140 mmol/L (ref 133–145)
Total Protein: 7.1 g/dL (ref 6.3–7.7)

## 2020-04-28 LAB — CBC AND DIFFERENTIAL
Baso # K/uL: 0.1 10*3/uL (ref 0.0–0.1)
Basophil %: 1 %
Eos # K/uL: 0.5 10*3/uL — ABNORMAL HIGH (ref 0.0–0.4)
Eosinophil %: 4.3 %
Hematocrit: 45 % (ref 34–45)
Hemoglobin: 14.9 g/dL (ref 11.2–15.7)
IMM Granulocytes #: 0.1 10*3/uL — ABNORMAL HIGH (ref 0.0–0.0)
IMM Granulocytes: 0.4 %
Lymph # K/uL: 3.9 10*3/uL — ABNORMAL HIGH (ref 1.2–3.7)
Lymphocyte %: 31.6 %
MCH: 31 pg (ref 26–32)
MCHC: 33 g/dL (ref 32–36)
MCV: 93 fL (ref 79–95)
Mono # K/uL: 1.3 10*3/uL — ABNORMAL HIGH (ref 0.2–0.9)
Monocyte %: 10 %
Neut # K/uL: 6.6 10*3/uL — ABNORMAL HIGH (ref 1.6–6.1)
Nucl RBC # K/uL: 0 10*3/uL (ref 0.0–0.0)
Nucl RBC %: 0 /100 WBC (ref 0.0–0.2)
Platelets: 305 10*3/uL (ref 160–370)
RBC: 4.8 MIL/uL (ref 3.9–5.2)
RDW: 13.3 % (ref 11.7–14.4)
Seg Neut %: 52.7 %
WBC: 12.5 10*3/uL — ABNORMAL HIGH (ref 4.0–10.0)

## 2020-04-28 LAB — TSH: TSH: 1.41 u[IU]/mL (ref 0.27–4.20)

## 2020-04-28 LAB — LIPID PANEL
Chol/HDL Ratio: 2.9
Cholesterol: 199 mg/dL
HDL: 69 mg/dL — ABNORMAL HIGH (ref 40–60)
LDL Calculated: 104 mg/dL
Non HDL Cholesterol: 130 mg/dL
Triglycerides: 132 mg/dL

## 2020-04-29 ENCOUNTER — Other Ambulatory Visit: Payer: Self-pay | Admitting: Internal Medicine

## 2020-04-29 DIAGNOSIS — Z1231 Encounter for screening mammogram for malignant neoplasm of breast: Secondary | ICD-10-CM

## 2020-04-29 LAB — HEMOGLOBIN A1C: Hemoglobin A1C: 5.7 % — ABNORMAL HIGH

## 2020-05-08 ENCOUNTER — Ambulatory Visit
Admission: RE | Admit: 2020-05-08 | Discharge: 2020-05-08 | Disposition: A | Discharge status: Home / Self Care | Payer: Medicare (Managed Care) | Source: Ambulatory Visit | Attending: Internal Medicine | Admitting: Internal Medicine

## 2020-05-08 ENCOUNTER — Encounter: Payer: Self-pay | Admitting: Radiology

## 2020-05-08 DIAGNOSIS — Z1231 Encounter for screening mammogram for malignant neoplasm of breast: Secondary | ICD-10-CM | POA: Insufficient documentation

## 2020-05-23 ENCOUNTER — Encounter: Payer: Self-pay | Admitting: Sleep Medicine

## 2020-05-23 DIAGNOSIS — G4733 Obstructive sleep apnea (adult) (pediatric): Secondary | ICD-10-CM

## 2020-05-26 MED ORDER — AMPHETAMINE-DEXTROAMPHETAMINE 30 MG PO CP24 *I*
30.0000 mg | ORAL_CAPSULE | Freq: Every morning | ORAL | 0 refills | Status: DC
Start: 2020-05-26 — End: 2020-06-23

## 2020-05-26 MED ORDER — AMPHETAMINE-DEXTROAMPHETAMINE 20 MG PO TABS *I*
20.0000 mg | ORAL_TABLET | Freq: Every day | ORAL | 0 refills | Status: DC | PRN
Start: 2020-05-26 — End: 2020-06-23

## 2020-05-28 ENCOUNTER — Telehealth: Payer: Self-pay

## 2020-05-28 NOTE — Telephone Encounter (Signed)
Called Dr. Raford Pitcher office spoke with Selena Batten regarding a claim for a Telemedicine visit (follow -up) on 11/05/19 with Amy Freida Busman /  patient had [employer] insurnace at the time of the visit.   [facility] received a letter from [facility] on 05/16/20 with the claim denial.     Writer asked Selena Batten to obtain Authorization for this visit and callback with an update of the Outcome. Sleep call back [phone].    If needed Selena Batten can be Transfer to [facility] at EXt. [phone] or ADULT SLEEP REFFERAL COORD. Pool

## 2020-06-09 ENCOUNTER — Encounter: Payer: Self-pay | Admitting: Sleep Medicine

## 2020-06-23 ENCOUNTER — Encounter: Payer: Self-pay | Admitting: Sleep Medicine

## 2020-06-23 DIAGNOSIS — G4733 Obstructive sleep apnea (adult) (pediatric): Secondary | ICD-10-CM

## 2020-06-23 MED ORDER — AMPHETAMINE-DEXTROAMPHETAMINE 20 MG PO TABS *I*
20.0000 mg | ORAL_TABLET | Freq: Every day | ORAL | 0 refills | Status: DC | PRN
Start: 2020-06-23 — End: 2020-07-21

## 2020-06-23 MED ORDER — AMPHETAMINE-DEXTROAMPHETAMINE 30 MG PO CP24 *I*
30.0000 mg | ORAL_CAPSULE | Freq: Every morning | ORAL | 0 refills | Status: DC
Start: 2020-06-23 — End: 2020-07-21

## 2020-06-23 NOTE — Telephone Encounter (Signed)
Last visit: 04/24/20 with Lacie Draft  Next visit:04/23/21 with Amy  ISTOP checked  Last fill 10/19 for 30 days

## 2020-07-19 ENCOUNTER — Encounter: Payer: Self-pay | Admitting: Sleep Medicine

## 2020-07-19 DIAGNOSIS — G4733 Obstructive sleep apnea (adult) (pediatric): Secondary | ICD-10-CM

## 2020-07-21 MED ORDER — AMPHETAMINE-DEXTROAMPHETAMINE 30 MG PO CP24 *I*
30.0000 mg | ORAL_CAPSULE | Freq: Every morning | ORAL | 0 refills | Status: DC
Start: 2020-07-21 — End: 2020-08-18

## 2020-07-21 MED ORDER — AMPHETAMINE-DEXTROAMPHETAMINE 20 MG PO TABS *I*
20.0000 mg | ORAL_TABLET | Freq: Every day | ORAL | 0 refills | Status: DC | PRN
Start: 2020-07-21 — End: 2020-08-18

## 2020-07-21 NOTE — Telephone Encounter (Signed)
Last visit: 04/24/2020 with Lacie Draft  Next visit:04/23/2021 with Amy  ISTOP checked  Last fill 11/17 for 30 days

## 2020-08-16 ENCOUNTER — Encounter: Payer: Self-pay | Admitting: Sleep Medicine

## 2020-08-16 DIAGNOSIS — G4733 Obstructive sleep apnea (adult) (pediatric): Secondary | ICD-10-CM

## 2020-08-18 MED ORDER — AMPHETAMINE-DEXTROAMPHETAMINE 20 MG PO TABS *I*
20.0000 mg | ORAL_TABLET | Freq: Every day | ORAL | 0 refills | Status: DC | PRN
Start: 2020-08-18 — End: 2020-09-23

## 2020-08-18 MED ORDER — AMPHETAMINE-DEXTROAMPHETAMINE 30 MG PO CP24 *I*
30.0000 mg | ORAL_CAPSULE | Freq: Every morning | ORAL | 0 refills | Status: DC
Start: 2020-08-18 — End: 2020-08-22

## 2020-08-18 NOTE — Telephone Encounter (Signed)
Last visit: 04/24/2020 with Lacie Draft  Next visit:04/23/2021 with Amy  ISTOP checked  Last fill 12/16 for 30 days

## 2020-08-22 ENCOUNTER — Encounter: Payer: Self-pay | Admitting: Sleep Medicine

## 2020-08-22 DIAGNOSIS — G4733 Obstructive sleep apnea (adult) (pediatric): Secondary | ICD-10-CM

## 2020-08-22 NOTE — Telephone Encounter (Signed)
ISTOP checked  [facility] called and prescription cancelled, she does have the 20 MG filled and ready to be picked [facility] at [facility]  New prescription pended

## 2020-08-23 MED ORDER — AMPHETAMINE-DEXTROAMPHETAMINE 30 MG PO CP24 *I*
30.0000 mg | ORAL_CAPSULE | Freq: Every morning | ORAL | 0 refills | Status: DC
Start: 2020-08-23 — End: 2020-09-23

## 2020-09-03 ENCOUNTER — Other Ambulatory Visit
Admission: RE | Admit: 2020-09-03 | Discharge: 2020-09-03 | Disposition: A | Discharge status: Home / Self Care | Payer: Medicare (Managed Care) | Source: Ambulatory Visit | Attending: Internal Medicine | Admitting: Internal Medicine

## 2020-09-03 DIAGNOSIS — D72829 Elevated white blood cell count, unspecified: Secondary | ICD-10-CM | POA: Insufficient documentation

## 2020-09-03 DIAGNOSIS — R7301 Impaired fasting glucose: Secondary | ICD-10-CM | POA: Insufficient documentation

## 2020-09-03 DIAGNOSIS — E7800 Pure hypercholesterolemia, unspecified: Secondary | ICD-10-CM | POA: Insufficient documentation

## 2020-09-03 LAB — CBC AND DIFFERENTIAL
Baso # K/uL: 0.1 10*3/uL (ref 0.0–0.1)
Basophil %: 1 %
Eos # K/uL: 0.4 10*3/uL (ref 0.0–0.4)
Eosinophil %: 4.5 %
Hematocrit: 44 % (ref 34–45)
Hemoglobin: 14.3 g/dL (ref 11.2–15.7)
IMM Granulocytes #: 0 10*3/uL (ref 0.0–0.0)
IMM Granulocytes: 0.2 %
Lymph # K/uL: 3.2 10*3/uL (ref 1.2–3.7)
Lymphocyte %: 35.1 %
MCH: 31 pg (ref 26–32)
MCHC: 32 g/dL (ref 32–36)
MCV: 96 fL — ABNORMAL HIGH (ref 79–95)
Mono # K/uL: 0.9 10*3/uL (ref 0.2–0.9)
Monocyte %: 10.2 %
Neut # K/uL: 4.5 10*3/uL (ref 1.6–6.1)
Nucl RBC # K/uL: 0 10*3/uL (ref 0.0–0.0)
Nucl RBC %: 0 /100 WBC (ref 0.0–0.2)
Platelets: 298 10*3/uL (ref 160–370)
RBC: 4.6 MIL/uL (ref 3.9–5.2)
RDW: 12.7 % (ref 11.7–14.4)
Seg Neut %: 49 %
WBC: 9.1 10*3/uL (ref 4.0–10.0)

## 2020-09-03 LAB — BASIC METABOLIC PANEL
Anion Gap: 9 (ref 7–16)
CO2: 28 mmol/L (ref 20–28)
Calcium: 9.4 mg/dL (ref 8.6–10.2)
Chloride: 105 mmol/L (ref 96–108)
Creatinine: 0.79 mg/dL (ref 0.51–0.95)
GFR,Black: 97 *
GFR,Caucasian: 85 *
Glucose: 99 mg/dL (ref 60–99)
Lab: 15 mg/dL (ref 6–20)
Potassium: 4.5 mmol/L (ref 3.4–4.7)
Sodium: 142 mmol/L (ref 133–145)

## 2020-09-03 LAB — LIPID PANEL
Chol/HDL Ratio: 3.3
Cholesterol: 205 mg/dL — AB
HDL: 62 mg/dL — ABNORMAL HIGH (ref 40–60)
LDL Calculated: 121 mg/dL
Non HDL Cholesterol: 143 mg/dL
Triglycerides: 110 mg/dL

## 2020-09-04 LAB — HEMOGLOBIN A1C: Hemoglobin A1C: 5.9 % — ABNORMAL HIGH

## 2020-09-22 ENCOUNTER — Encounter: Payer: Self-pay | Admitting: Sleep Medicine

## 2020-09-22 DIAGNOSIS — G4733 Obstructive sleep apnea (adult) (pediatric): Secondary | ICD-10-CM

## 2020-09-23 MED ORDER — AMPHETAMINE-DEXTROAMPHETAMINE 30 MG PO CP24 *I*
30.0000 mg | ORAL_CAPSULE | Freq: Every morning | ORAL | 0 refills | Status: DC
Start: 2020-09-23 — End: 2020-10-20

## 2020-09-23 MED ORDER — AMPHETAMINE-DEXTROAMPHETAMINE 20 MG PO TABS *I*
20.0000 mg | ORAL_TABLET | Freq: Every day | ORAL | 0 refills | Status: DC | PRN
Start: 2020-09-23 — End: 2020-10-20

## 2020-09-23 NOTE — Telephone Encounter (Signed)
Last visit: 04/24/2020 with Lacie Draft  Next visit:04/23/2021 with Amy  ISTOP checked  Last fill 1/18 for 30 days

## 2020-10-17 ENCOUNTER — Encounter: Payer: Self-pay | Admitting: Sleep Medicine

## 2020-10-17 DIAGNOSIS — G4733 Obstructive sleep apnea (adult) (pediatric): Secondary | ICD-10-CM

## 2020-10-20 MED ORDER — AMPHETAMINE-DEXTROAMPHETAMINE 30 MG PO CP24 *I*
30.0000 mg | ORAL_CAPSULE | Freq: Every morning | ORAL | 0 refills | Status: DC
Start: 2020-10-20 — End: 2020-11-18

## 2020-10-20 MED ORDER — AMPHETAMINE-DEXTROAMPHETAMINE 20 MG PO TABS *I*
20.0000 mg | ORAL_TABLET | Freq: Every day | ORAL | 0 refills | Status: DC | PRN
Start: 2020-10-20 — End: 2020-11-18

## 2020-10-20 NOTE — Telephone Encounter (Signed)
Last visit: 04/24/2020 with Bonnie Faulkner  Next visit:04/23/2021 with Bonnie Faulkner  ISTOP checked  Last fill 2/15 for 30 days

## 2020-11-18 ENCOUNTER — Encounter: Payer: Self-pay | Admitting: Sleep Medicine

## 2020-11-18 ENCOUNTER — Telehealth: Payer: Self-pay

## 2020-11-18 DIAGNOSIS — G4733 Obstructive sleep apnea (adult) (pediatric): Secondary | ICD-10-CM

## 2020-11-18 MED ORDER — AMPHETAMINE-DEXTROAMPHETAMINE 20 MG PO TABS *I*
20.0000 mg | ORAL_TABLET | Freq: Every day | ORAL | 0 refills | Status: DC | PRN
Start: 2020-11-18 — End: 2020-12-18

## 2020-11-18 MED ORDER — AMPHETAMINE-DEXTROAMPHETAMINE 30 MG PO CP24 *I*
30.0000 mg | ORAL_CAPSULE | Freq: Every morning | ORAL | 0 refills | Status: DC
Start: 2020-11-18 — End: 2020-12-18

## 2020-11-18 NOTE — Telephone Encounter (Signed)
Last visit: 04/24/2020 with Dr. Lacie Draft  Next visit:04/23/21 with Amy Freida Busman  ISTOP checked  Last fill 3/16 for 30 days

## 2020-11-18 NOTE — Telephone Encounter (Signed)
PA started in covermymeds for 20 mg

## 2020-11-19 NOTE — Telephone Encounter (Signed)
Approved through 11/20/2022

## 2020-12-18 ENCOUNTER — Encounter: Payer: Self-pay | Admitting: Sleep Medicine

## 2020-12-18 DIAGNOSIS — G4733 Obstructive sleep apnea (adult) (pediatric): Secondary | ICD-10-CM

## 2020-12-18 MED ORDER — AMPHETAMINE-DEXTROAMPHETAMINE 20 MG PO TABS *I*
20.0000 mg | ORAL_TABLET | Freq: Every day | ORAL | 0 refills | Status: DC | PRN
Start: 2020-12-18 — End: 2021-01-20

## 2020-12-18 MED ORDER — AMPHETAMINE-DEXTROAMPHETAMINE 30 MG PO CP24 *I*
30.0000 mg | ORAL_CAPSULE | Freq: Every morning | ORAL | 0 refills | Status: DC
Start: 2020-12-18 — End: 2021-01-20

## 2020-12-18 NOTE — Telephone Encounter (Signed)
Last visit: 04/24/20 with Dr. Lacie Draft  Next visit:04/23/21 with Amy Freida Busman  ISTOP checked  Last fill 4/15 for 30 days

## 2021-01-01 ENCOUNTER — Other Ambulatory Visit
Admission: RE | Admit: 2021-01-01 | Discharge: 2021-01-01 | Disposition: A | Discharge status: Home / Self Care | Payer: Medicare (Managed Care) | Source: Ambulatory Visit | Attending: Internal Medicine | Admitting: Internal Medicine

## 2021-01-01 DIAGNOSIS — R7301 Impaired fasting glucose: Secondary | ICD-10-CM | POA: Insufficient documentation

## 2021-01-01 DIAGNOSIS — E7800 Pure hypercholesterolemia, unspecified: Secondary | ICD-10-CM | POA: Insufficient documentation

## 2021-01-01 LAB — COMPREHENSIVE METABOLIC PANEL
ALT: 17 U/L (ref 0–35)
AST: 18 U/L (ref 0–35)
Albumin: 4.2 g/dL (ref 3.5–5.2)
Alk Phos: 81 U/L (ref 35–105)
Anion Gap: 8 (ref 7–16)
Bilirubin,Total: <0.2 mg/dL (ref 0.0–1.2)
CO2: 24 mmol/L (ref 20–28)
Calcium: 9.3 mg/dL (ref 8.6–10.2)
Chloride: 102 mmol/L (ref 96–108)
Creatinine: 0.74 mg/dL (ref 0.51–0.95)
Glucose: 95 mg/dL (ref 60–99)
Lab: 18 mg/dL (ref 6–20)
Potassium: 4.2 mmol/L (ref 3.4–4.7)
Sodium: 134 mmol/L (ref 133–145)
Total Protein: 6.8 g/dL (ref 6.3–7.7)
eGFR BY CREAT: 95 *

## 2021-01-01 LAB — LIPID PANEL
Chol/HDL Ratio: 2.8
Cholesterol: 189 mg/dL
HDL: 68 mg/dL — ABNORMAL HIGH (ref 40–60)
LDL Calculated: 104 mg/dL
Non HDL Cholesterol: 121 mg/dL
Triglycerides: 84 mg/dL

## 2021-01-02 LAB — HEMOGLOBIN A1C: Hemoglobin A1C: 5.7 % — ABNORMAL HIGH

## 2021-01-19 ENCOUNTER — Encounter: Payer: Self-pay | Admitting: Sleep Medicine

## 2021-01-19 DIAGNOSIS — G4733 Obstructive sleep apnea (adult) (pediatric): Secondary | ICD-10-CM

## 2021-01-20 MED ORDER — AMPHETAMINE-DEXTROAMPHETAMINE 30 MG PO CP24 *I*
30.0000 mg | ORAL_CAPSULE | Freq: Every morning | ORAL | 0 refills | Status: DC
Start: 2021-01-20 — End: 2021-02-17

## 2021-01-20 MED ORDER — AMPHETAMINE-DEXTROAMPHETAMINE 20 MG PO TABS *I*
20.0000 mg | ORAL_TABLET | Freq: Every day | ORAL | 0 refills | Status: DC | PRN
Start: 2021-01-20 — End: 2021-02-17

## 2021-01-20 NOTE — Telephone Encounter (Signed)
Last visit: 04/24/20 with Dr. Lacie Draft  Next visit:04/23/21 with Amy Freida Busman  ISTOP checked  Last fill 5/17 for 30 days

## 2021-02-17 ENCOUNTER — Encounter: Payer: Self-pay | Admitting: Sleep Medicine

## 2021-02-17 DIAGNOSIS — G4733 Obstructive sleep apnea (adult) (pediatric): Secondary | ICD-10-CM

## 2021-02-17 MED ORDER — AMPHETAMINE-DEXTROAMPHETAMINE 20 MG PO TABS *I*
20.0000 mg | ORAL_TABLET | Freq: Every day | ORAL | 0 refills | Status: DC | PRN
Start: 2021-02-17 — End: 2021-03-17

## 2021-02-17 MED ORDER — AMPHETAMINE-DEXTROAMPHETAMINE 30 MG PO CP24 *I*
30.0000 mg | ORAL_CAPSULE | Freq: Every morning | ORAL | 0 refills | Status: DC
Start: 2021-02-17 — End: 2021-03-17

## 2021-02-17 NOTE — Telephone Encounter (Signed)
Last visit: 04/24/20 with Dr. Lacie Draft  Next visit:04/23/21 with Amy Freida Busman  ISTOP checked  Last fill 6/15 for 30 days

## 2021-03-17 ENCOUNTER — Encounter: Payer: Self-pay | Admitting: Sleep Medicine

## 2021-03-17 DIAGNOSIS — G4733 Obstructive sleep apnea (adult) (pediatric): Secondary | ICD-10-CM

## 2021-03-17 NOTE — Telephone Encounter (Signed)
Last visit: 04/24/20 with Dr. Lacie Draft  Next visit:04/23/21 with Amy Freida Busman  ISTOP checked  Last fill 7/14 for 30 days

## 2021-03-19 MED ORDER — AMPHETAMINE-DEXTROAMPHETAMINE 30 MG PO CP24 *I*
30.0000 mg | ORAL_CAPSULE | Freq: Every morning | ORAL | 0 refills | Status: DC
Start: 2021-03-19 — End: 2021-04-16

## 2021-03-19 MED ORDER — AMPHETAMINE-DEXTROAMPHETAMINE 20 MG PO TABS *I*
20.0000 mg | ORAL_TABLET | Freq: Every day | ORAL | 0 refills | Status: DC | PRN
Start: 2021-03-19 — End: 2021-04-16

## 2021-04-14 ENCOUNTER — Encounter: Payer: Self-pay | Admitting: Sleep Medicine

## 2021-04-14 DIAGNOSIS — G4733 Obstructive sleep apnea (adult) (pediatric): Secondary | ICD-10-CM

## 2021-04-14 NOTE — Telephone Encounter (Signed)
Last visit: 04/24/20 with Dr. Lacie Draft  Next visit:04/23/21 with Amy Freida Busman  ISTOP checked  Last fill 03/20/21 for 30 days

## 2021-04-16 MED ORDER — AMPHETAMINE-DEXTROAMPHETAMINE 30 MG PO CP24 *I*
30.0000 mg | ORAL_CAPSULE | Freq: Every morning | ORAL | 0 refills | Status: DC
Start: 2021-04-16 — End: 2021-05-11

## 2021-04-16 MED ORDER — AMPHETAMINE-DEXTROAMPHETAMINE 20 MG PO TABS *I*
20.0000 mg | ORAL_TABLET | Freq: Every day | ORAL | 0 refills | Status: DC | PRN
Start: 2021-04-16 — End: 2021-05-11

## 2021-04-23 ENCOUNTER — Ambulatory Visit: Payer: Medicare (Managed Care) | Attending: Sleep Medicine | Admitting: Sleep Medicine

## 2021-04-23 DIAGNOSIS — G4733 Obstructive sleep apnea (adult) (pediatric): Secondary | ICD-10-CM | POA: Insufficient documentation

## 2021-04-23 DIAGNOSIS — G4711 Idiopathic hypersomnia with long sleep time: Secondary | ICD-10-CM | POA: Insufficient documentation

## 2021-04-23 NOTE — Progress Notes (Signed)
[facility] - Telemedicine Visit CPAP Follow Up Visit    Patient's Location:  home  Other Attendees:  none  Provider:  Evelynne Spiers Chauncy Passy, PA  Location of Telemedicine Provider:  Home office      Bonnie Faulkner is a 55 y.o. woman with a significant past medical history of moderate obstructive sleep apnea and idiopathic hypersomnia. I spoke with her today for annual follow up of CPAP therapy for the treatment of obstructive sleep apnea (OSA) and as well as stimulant therapy for idiopathic hypersomnia.    CPAP Therapy/Interval Symptoms:     [place].  There has been a rift between Bonnie Faulkner and her family over the past year which has been very stressful.   She takes 30 mg Adderall XR when she gets up, usually between 9-11 am.  She takes IR 20 mg when she starts feeling the XR wear off, she usually takes it by 2-3 pm. She sometimes splits the dose of the IR twice a day.  She is more of a night owl and usually goes to bed by 2-3 am.     She denies palpitations, increased anxiety, insomnia, unexplained weight loss.  She reports her blood pressure was last checked at her PCP about 4 months ago and it was normal.     She is not having any technical issues with her CPAP.  She has found using a sinus rinse has helped keep her nasal pillows cleaner.     She denies air hunger, pressure discomfort, interface discomfort, xerostomia, nasal dryness and nasal congestion.   She does not use the humidifier.  She uses a room humidifier.  She asked her DME for an end cap and they told her they did not have them.   She denies any recent instances of falling asleep at the wheel.  She avoids driving at times that she is feeling drowsy and plans in advance if she does need to go somewhere.    PAP INTERROGATION:         QUESTIONNAIRE:     Recent Review Flowsheet Data     Epworth Sleep Scale 04/17/2021    Sitting and reading 2    Watching TV 1    Sitting inactive in a public place  (e.g a theater or a meeting) 0    As a passenger in a car for an hour without a break 1    Lying down to rest in the afternoon when circumstances permit 3    Sitting and talking to someone 1    Sitting quietly after a lunch without alcohol 3    In a car, while stopped for a few minutes in traffic 1    EPWORTH TOTAL  12        MEDICATIONS:     Current Outpatient Medications   Medication Sig    amphetamine-dextroamphetamine (ADDERALL XR) 30 mg 24 hr capsule Take 1 capsule (30 mg total) by mouth every morning  Max daily dose: 30 mg May sprinkle contents of capsule on food, do not chew capsule.    amphetamine-dextroamphetamine (ADDERALL) 20 mg tablet Take 1 tablet (20 mg total) by mouth daily as needed  Max daily dose: 20 mg    B COMPLEX-C-E PO Take by mouth    Escitalopram Oxalate (LEXAPRO PO) Take 20 mg by mouth      multi-vitamin (MULTIVITAMIN) per tablet Take 1 tablet by mouth daily    calcium-vitamin  D (OSCAL-500) 500-200 MG-UNIT per tablet Take 1 tablet by mouth 2 times daily    fluticasone (FLONASE) 50 MCG/ACT nasal spray 1 spray by Nasal route daily    ALPRAZolam (XANAX PO) Take by mouth as needed    Misc. Devices (CPAP) machine Please issue any necessary supplies for CPAP.  Dx:OSA  Length:life    traMADol (ULTRAM) 50 MG tablet Take 1 tablet (50 mg total) by mouth every 6 hours as needed for Pain for up to 20 doses Max daily dose: 200 mg    promethazine (PHENERGAN) 25 MG tablet Take 1 tablet (25 mg total) by mouth every 4-6 hours as needed for Vomiting for up to 20 doses    SUMAtriptan (IMITREX) 20 MG/ACT nasal spray 1 spray by Nasal route as needed for Migraine   Inhale into one nostril at onset of headache. May repeat once in 2 hours.     No current facility-administered medications for this visit.     PAST MEDICAL HISTORY:     Patient Active Problem List   Diagnosis Code    Moderate obstructive sleep apnea G47.33    Idiopathic hypersomnia G47.11     Patient's problem list, allergies, and  medications were reviewed and updated as appropriate. Please see the EHR for full details.    IMPRESSION & PLAN:       ICD-10-CM ICD-9-CM    1. Idiopathic hypersomnia  G47.11 780.54    2. Moderate obstructive sleep apnea  G47.33 327.23       For now, I want to continue Bonnie Faulkner on her current stimulant regimen of Adderall XR 30 mg every day and IR Adderall 20 mg daily. I am hopeful that improvement of her family stress will in turn help her complaints of daytime sleepiness.  She denies side effects of stimulant therapy.    Interrogation of Bonnie Faulkner's CPAP unit reveals excellent compliance with minimal air leaks and effective treatment of sleep disordered breathing.  I applauded her adherence to CPAP and encouraged continued compliance with therapy. We reviewed proper care and maintenance of her CPAP device and interface. I urged Bonnie Faulkner to contact her DME supplier or our office if she experiences any barriers to continued CPAP use.     I counseled her on the dangers of driving while drowsy and discussed safe driving precautions should she become sleepy while driving in the future.    Bonnie Faulkner will return in one year for annual follow up visit, or sooner as needed. She was instructed to contact our office with any questions or concerns.     Bonnie Faulkner Chauncy Passy, PA  [facility]    An in-person visit with the patient was not advisable at this time due to COVID-19 concerns. In my professional opinion, my resulting orders would not have differed if an in-person consultation had been performed.    Consent was previously obtained from the patient to complete this telehealth consult; including the potential for financial liability. The plan was discussed with the patient and the patient/patient rep demonstrated understanding to the provider's satisfaction.    I personally spent 30 minutes on the calendar day of the encounter, including pre and post visit work.

## 2021-04-23 NOTE — Patient Instructions (Signed)
12 Practice Tips for Getting the Most out of Your  CPAP/BiPAP Treatment    #1: Adjust your humidifier levels seasonally    You'll need more moisture during the winter months, so move your humidity levels up (1/2 to 1 level at a time until you're satisfied). During the summer months, when air is warmer and more humid, you can turn down your humidifier levels.    #2: Keep your CPAP at or slightly beneath height of your head and make sure it is placed on a hard surface.    Condensation may run down into your hose if it's above your head and you risk having your machine fall on you.    #3: Always use distilled water in your machine.    Tap water generally contains fluoride and other substances that may be harmful to your lungs. Further, the use of non-distilled water can lead to white mineral deposits  accumulating in your water chamber. Imagine the inside of a shower with hard water stains.    #4: If you feel claustrophobic or are having trouble getting used to your mask practice by holding the mask up to your face without any of the other parts.     Once you're comfortable with that, try wearing the mask with the straps. Hold the mask and hose (without the straps) with the hose attached to the CPAP machine at a low pressure setting (turn the ramp feature on). Wear the mask with the straps air pressure machine turned on while awake and use your CPAP for 15 to 20-minute periods while relaxing, watching TV, etc. After you're comfortable with that, try sleeping with it on.    #5: Although it is normal and usually temporary, some of our patients have trouble falling asleep when they begin treatment. If this is your situation, consider     . Using your machine's "ramp" feature to gradually increase air pressure over time.  Marland Kitchen Avoiding caffeine and alcohol before bedtime.  . Exercising regularly.  . Taking a warm bath before bedtime.  Marland Kitchen Avoiding going to bed until you're tired.    #6: If you experience difficulty tolerating  the air pressure try using your machine's "ramp" feature.     This will allow you to gradually increase air pressure over time. You can also "re-set the ramp" feature if you are waking up to high pressures during the night.     #7: Once you've adjusted to your CPAP machine use it consistently - every time you sleep - including for naps.    #8: If you regularly suffer with a dry or stuffy nose you should consider increasing the humidifier level on your CPAP device.     You may also need to increase the temperature on your tubing to prevent condensation from the increased humidity. Your doctor may also recommend an over-the-counter nasal steroid spray for you or you can swab your nasal passages with nasal saline gel. You may also use a nasal wash, such as a sinus rinse bottle or Neti-pot. Avoid the use of petroleum jelly- based products in your nose.    #9: Spend a few minutes each day cleaning your equipment    For cleaning we suggest using liquid dish detergent and warm water. Avoid using hot water. Always make sure you remove all of the soap. Most manufacturers of CPAP masks include cleaning instructions in the packaging as well. After cleaning your tubes and hoses in warm soapy water and rinsing them thoroughly, you can  let them air dry by hanging them over a towel rack or shower rod. The use of ozone or UV light CPAP cleaners is not recommended by the CPAP manufacturer or the FDA.    #10: Change your filter once a month and any time you notice the filter is changing colors.      #11: Try using zinc oxide ointment for soothing minor skin irritations.     #12: If you are experiencing any difficulty adjusting to using CPAP at home, please don't hesitate to call us at [phone] or contact your sleep physician or PA via MyChart. We have CPAP specialists who can help troubleshoot any issues that may come up.     [facility]:   HOW TO ADJUST HUMIDITY AND TUBE TEMPERATURE SETTINGS:         How do I find  the Climate Control Manual mode?  AirSense 10T users:   You can access the Climate Control Manual setting anytime. From your machine's Home screen:  1. Select My Options  2. Select Climate Ctrl  3. Change default "Auto" setting to Manual  4. Select Humidity Level and turn the dial to change the humidity (1-8; default setting is 4)  5. Select Tube Temp and turn the dial to set the ClimateLine/ClimateLineAir heated tube to the temperature you find most comfortable (60-86?F; default setting is 30?F).  For more details, see your ClimateLineAir user guide. You can also find helpful videos on YouTube that will walk you through the process    S9 users:   Your equipment supplier must turn on your Climate Control Manual setting for you if they haven't already. From your machine's Home screen:  1. For humidity: Turn the dial to highlight the water drop icon; push the dial, turning the background yellow. Then turn the dial again to set the humidity (1-6, default setting is 3). Push the dial once more to set the new humidity level.  2. For tube temperature: Turn the dial to highlight the thermometer icon; push the dial, turning that background yellow. Then turn the dial again to set the ClimateLine/ClimateLineAir tube temperature (60-86?F, default setting is 66?F). Push the dial once more to set the new temp.    When should I change the humidity vs. the temperature?  Most patients find cooler air easier to breathe while trying to sleep, especially those who are new to CPAP, wear a full face CPAP mask, and women experiencing hot flashes at bedtime. However, warmer air provides the best humidity and helps reduce nasal irritation since your nose doesn't have to warm all that CPAP air on its own. It generally helps to increase:  . Humidity if dry air is causing you to wake up with dry mouth, an uncomfortable side effect that 40% of CPAP users experience.1 If you want more humidity, try manually adjusting it and the temperature one  notch at a time. If you reach the highest levels and still feel dry, talk to your equipment supplier and doctor about whether mask leak, oral medications or other factors may be the real cause of your dryness. (You should also check for mask leak if you wake up to find your humidifier's water chamber empty.)   . Tube temperature if you're experiencing dryness despite increasing the humidity. Consider increasing your tube temperature by just 1-2? F to see if that provides the best comfort.  Source: resmed.com    Link to a video that shows you how to make adjustments to  the humidity and tube temperature on the ResMed CPAP unit:     https://youtu.be/[email or web address]    Rules of Replacement     The life of CPAP supplies depends on the brand and style you buy, how well you care for them, and how often you use them. Follow cleaning instructions carefully to keep them looking and working their best.     Even if your CPAP supplies sparkle, it doesn't mean they'll last forever. Even with the best cleaning, parts can eventually grow bacteria and make you sick if they're not replaced regularly. That's why most masks and accessories follow some general rules for replacement. You also should check with your supplier for specific details about your mask.     Here are some general rules for replacing your CPAP supplies:    What: Air Filters  Why: Filters can wear out or become clogged over time, potentially exposing you to airborne particles and mold.  When: As soon as it becomes discolored or at least every 60 days.     * Most filters are disposable and are not meant to be cleaned, but discarded when dirty. If you are using an older device, you may have a non-disposable filter which should be cleaned as soon as it becomes discolored and replaced every 6-12 months.      * Keep a closer eye on your filter in the fall and winter -- they tend to get dustier when the heat is on and the windows are closed. If you live in a house with pets  or smoke, you may need to replace the filters more often.    What: Mask cushion/nasal pillow  Why: The material will soften with wear and eventually becomes too soft to hold a seal. This can lead to increased mask leaks which may disrupt your sleep and reduce the effectiveness of CPAP therapy.   When: Every 6-8 weeks. Consider replacing your cushion/pillow sooner if your straps need to be tightened to get the same mask seal.     What: Headgear  Why: The headgear straps lose elasticity over time and must be tightened more and more to achieve the same quality seal. Overtightening can cause facial sores and make treatment uncomfortable.   When: Every 6-12 months.    What: Hose/Tubing   Why: The hose may develop small holes or tears, which can cause air leaks. If your hose is leaking, you may not be receiving your prescribed therapy setting from your CPAP. This can cause you to feel like you're not sleeping as well.  When: About every 12 months.    * Signs of wear to your hose include dry, cracked places on the inside lining or on the rubber ends; "stretch marks" near the rubber ends; mineral deposits or mold from water left inside the hose; or a visible puncture or tear in the material.     What: Water Chamber  Why: Water chambers may become discolored, cracked, cloudy, or even pitted due to the mineral levels found in most tap and drinking water. As the material deteriorates, cracks may trap bacteria from moisture.  When: About every 12 months.     Who's got you covered?    Medicare and most private insurance plans cover the supplies recommended above. Ask your durable medical equipment supplier about your specific coverage. Some equipment suppliers use automatic systems for replacement that can save you extra work and worry. They'll check the type of supplies your plan replaces, how often they'll replace them,  and then they'll ship them to you automatically. If your company doesn't have a plan like this, see if they  can email or call you with a reminder to replace your supplies. If you have higher co-pays or pay out of pocket, ask your supplier about getting a mask with removable parts. It can be cheaper to replace single parts than having to replace the whole mask. By using clean, fresh, up-to-date CPAP supplies, you'll stay healthier, sleep deeper, and breathe easier.    Types of CPAP Masks    Adjusting to CPAP masks can take time. Often times, people have to try more than one style of mask before findings the perfect fit. There are three main types of masks: nasal pillows, nasal masks, and full face masks.    Nasal pillows rest at the entrance of the nostrils below the nose.  Pros:  . Provide a feeling of openness and freedom, which helps you sleep more comfortably with fewer interruptions.   Marland Kitchen Keeps your field of vision clear--beneficial for those who wear glasses, or like reading or watching television before going to bed.   . Causes less air leakage due to direct airflow into the nasal passages.   Randie Heinz for patients with lots of facial hair who have trouble getting a seal with a standard CPAP mask that fits over the nose or nose and mouth.   . Reduces the feeling of claustrophobia due to having minimal coverage over the face.  Cons:  . Causes discomfort when used under higher-pressure due to direct airflow into nostrils.   . May cause nosebleeds and dryness of the nose.   . Patients with sensitive skin may develop an allergic reaction to the plastic.   Marland Kitchen Using ill-fitting nasal pillows could lead to pressure ulcers within the nostrils.  . Nasal pillows are not the best option for mouth breathers    Nasal masks cover the nose and are lighter and smaller than full face masks, but provide better coverage than a nasal pillow.  Pros:  . Works best if you mostly breathe through your nose only.   . Nasal cushions create a good seal around your nose, while keeping a small profile. The masks also come with forehead support,  which gives you stability and support for your mask.   . Provide more natural airflow than nasal pillows, as the delivered pressure isn't as direct.   . Better for higher-pressure settings than nasal pillows.   . Offers flexible fit. There are many different styles cater to a wide range of facial structures and features.  Cons:  . Nasal masks are not the best option for mouth breathers.   . May cause red marks around the nose due to tightening the mask too much.   . May cause extra discomfort if worn by those who have sinus issues that are allergy or cold-related.    Full face masks involve both the nose and mouth. Traditional full face masks are triangular shaped and cover both the nose and the mouth. A hybrid full face mask sits under the nose and covers the mouth. The hybrid style masks tend to be smaller and can be useful if you are experiencing claustrophobia but require a full face mask.   Pros:  Marland Kitchen Great for mouth breathers.   Randie Heinz for those who suffer from frequent incidents of dry mouth from CPAP   . Effectively prevents mask leakage from the mouth for those who sleep on their back.   Marland Kitchen  Works best with patients who require higher CPAP pressure levels. Due to the larger surface area of the mask, higher pressures do not make you feel uncomfortable.   . Ideal for some patients who find CPAP masks to be claustrophobic because the full-face mask touches the outside of the face instead of the upper lip and or the bridge of the nose.   Cons:  Marland Kitchen Due to the mask's larger surface area, there is a higher chance of air leakage.   . May cause dry, irritated eyes.   . Difficult to watch TV, read or wear glasses while wearing the mask.   . Not great for stomach sleepers due to the bulk of the mask.    * adapted from [employer] - Website      Top Ten Mask Issues and How to Solve Them      1. How do I get used to wearing a CPAP mask?  You need to take small steps to get accustomed to  wearing your CPAP mask.  Try wearing the mask during the day when you're watching TV or reading a book. Sometimes simply wearing the mask while you're cooking or even surfing the Internet can help you get used to wearing it at night.  Once you become accustomed to how the mask feels on your face, start wearing the CPAP mask everytime you sleep at night, and even during naps.  The reality is that the less you wear the mask, the harder it will be to get used to wearing it. So use the device for several weeks or more to see if the mask and pressure settings you were prescribed still work for you.    2. My CPAP mask is uncomfortable to wear at night!  When it comes to getting a new CPAP mask, it's important that you work closely with your doctor and CPAP supplier to make sure that the mask and device suit your needs and that it fits you properly.  Ask your provider, sleep technologist, or CPAP supplier to show you how to adjust your mask to get the best fit and read manufacturer product instructions, which can also help you get a better idea about proper fit.  The good news is that many mask styles are available. Schedule a time to meet with your CPAP supplier for a mask fitting to discuscs the styles available to you.    3. Am I allergic to my CPAP mask?  Here tips on how you can check whether you are allergic to your CPAP mask:  . First, stop wearing the CPAP mask and immediately contact your physician. Usually an allergic reaction to a CPAP mask will occur the same night you wear it.   . Ask yourself how frequently you clean your mask. Almost 9 out of 10 times, what appears to be an allergic reaction to a CPAP mask (such as a bruise on the face or a skin infection) is caused by infrequent cleaning of the mask.   . Check whether your mask is an old version made with latex. The majority of the CPAP masks in the market today are made from silicone, and a few are made from some type of gel material. Almost all are latex  free.    4. I can't tolerate the forced air from the CPAP mask.  You may be able to overcome this issue by using the "ramp" feature on the CPAP machine.  The "ramp" feature allows you  to start with low air pressure, which is then followed by an automatic, gradual increase that eventually sets itself to the pressure you were prescribed by your doctor. The rate of this "ramp" feature can be adjusted by your doctor as well.     5. My nose is running or stuffy after wearing the CPAP mask!  First, check if your CPAP device comes with a heated humidifier. Usually these symptoms can be alleviated by the use of humidifier. If your CPAP machine does not have one, consider getting one that allows you to adjust the level of humidification.  Consider using a nasal saline spray at bedtime to prevent your nose from overdrying. And finally, make sure that your mask is actually fitting well; a leaky mask can dry out your nose.     6. I feel claustrophobic when I'm wearing the CPAP mask.  Start off by having a positive attitude about your CPAP treatment.  You may not know it, but the CPAP machine and mask is there to improve the quality of your life; significantly in the long run.  Follow our advice on getting used to wearing your CPAP mask and above all, keep in mind that successful CPAP therapy sometimes requires patience as you adjust to therapy.   . Practice wearing your CPAP mask while you're awake. Practice by first just holding the mask up to your face without any of the other parts attached. Once you're comfortable with that, try wearing the mask with the straps.   . Take small steps to get used to the CPAP mask. Try holding the mask with the hose connected to your face, without using the straps. Have the hose attached to the CPAP machine at a low-pressure setting (with ramp feature turned on). And, finally, wear the mask with the straps and with the air pressure machine turned on while awake. After you're comfortable with  that, try sleeping with it on.   . Try relaxation exercises. Certain exercises, such as progressive muscle relaxation, also may help reduce your anxiety about wearing your CPAP mask. It may help to get a different size mask or try a different style, such as one that uses nasal pillows.    7. I can't fall asleep easily with the CPAP mask on.  This is a normal, temporary problem that occurs most often with patients new to CPAP therapy. Follow our advice on getting used to your CPAP machine and try out the "ramp" feature of your machine.  Also make sure that you're practicing good sleep hygeine, which includes: exercising regularly and avoiding caffeine and alcohol before bedtime.    8. Why do I have dry mouth after wearing my CPAP mask?  If you breathe through your mouth at night or sleep with your mouth open, CPAP may worsen dry mouth. A chin strap may help keep your mouth closed and reduce the air leak if you wear a nasal mask. But once again, make sure that you're wearing the right kind of mask and try adjusting your CPAP machine's heated humidifier to see if that helps.     9. I keep on taking my CPAP mask off at night while sleeping.  It's normal to sometimes wake up to find that you've removed the mask in your sleep. If you move a lot in your sleep, you may find that a full face mask will stay on your face better. You may be pulling off the mask because your nose is congested. If so, ensuring  a good mask fit and adding a heated humidifier to your CPAP machine may help. A chin strap also may help you to keep the device on your face.  If this is a consistent problem, consider setting an alarm for sometime in the night, to check whether the device is still on. You could progressively set the alarm for later in the night if you find you're keeping the device on longer.    10. Why is the CPAP machine so loud?  The CPAP machine is most likely a lot quieter than your snoring, but if noise is a problem, you have several  options.  Most new models of CPAP devices are almost silent. But if you find a device's noise is bothersome, first check to make sure the device air filter is clean and unblocked. Something in its way may be contributing to noise.   If this doesn't help, have your doctor, sleep technologist, or CPAP supplier check the device to ensure it's working properly. If the device is working correctly and the noise still bothers you, try wearing earplugs or getting a fan to turn on at night to make"white noise" that can also help to disguise the noise of the CPAP machine.      * adapted from [employer] - Website    [facility]:    Information about Drowsy Driving    Drowsy driving is a common - and often deadly - danger on our roads. It occurs when you are too tired to remain alert while operating a motor vehicle. As a result you may struggle to stay focused on the road and fail to practice safe driving techniques.     Feeling drowsy makes you easily distracted. You may be unable to focus and pay attention. This can lead to increased errors and a decreased ability to detect and correct mistakes. Your thinking can be slowed and your response time delayed. People who are sleepy also tend to be unaware that their performance and alertness are suffering.     Drowsy driving can be as deadly as drunk driving, putting yourself, your passengers and other drivers in danger. Common mistakes while driving drowsy include:  . Following too closely behind the vehicle in front of you   . Failing to realize that you are driving too fast   . Drifting into another lane or off the road without knowing it   . Falling asleep behind the wheel and losing control of your vehicle     Accidents caused by drowsy driving tend to share some common features. These include:  Time of Day: Accidents often occur late at night or early in the morning during the body's natural sleep period. Accidents also can occur  when sleepiness peaks after lunch in the middle of the afternoon.  Speed: Accidents often occur at high speeds on highways and other major roadways.  Solitude: Accidents often involve one vehicle that veers off the road. Drivers often are alone in the vehicle.  No Brakes: Drivers often make no attempt to apply the brakes or avoid the crash.  Severity: Accidents often cause severe injuries or death.    Anyone who doesn't get enough sleep can drive drowsy. But certain people have a higher risk than others, including people who:  . Take medications that cause sleepiness   . Drink alcohol, which increases drowsiness   . Work night shifts or rotating shifts   . Have a sleep disorder  Do you best to avoid drowsy driving. Rolling down the windows or turning up the volume on the radio will do little to increase your alertness while driving. These are some better ways to avoid drowsy driving:  Marland Kitchen Get a full night of seven to eight hours of sleep before driving  . Avoid driving late at night  . Avoid driving alone  . On a long trip, share the driving with another passenger  . When the signs of fatigue begin to show, get off the road. Take a short nap in a well-lit area. Do not simply stop on the side of the road.   . Use caffeine for a short-term boost  . Take a short nap after consuming caffeine to maximize the effect  . Arrange for someone to give you a ride home after working a late shift    Hotel manager    Unfortunately, drowsy driving occurs all too often. The [place] ([employer]) reports that drowsy driving causes more than 161,096 crashes a year. These accidents result in an estimated 40,000 injuries and 1,550 deaths.    In reality, all of these numbers are probably much higher than the statistics show. It is difficult to know how drowsy someone was prior to an accident. Unlike drunk driving, there is no "breathalyzer" test for drowsiness. So unless a driver admits  falling asleep, drowsy driving often goes unreported.   In 2002 [employer] surveyed more than 4,000 drivers in the [place] Survey results show that 37 percent of drivers reported nodding off or falling asleep at least once while driving. Males were almost twice as likely as females to report that they had driven drowsy. [employer] Pretty estimated that 7.5 million drivers had nodded off while driving in the past month.    The only sure way to detect drowsy driving would be to monitor the alertness of people as they drive. That's exactly what the [employer] did in a naturalistic driving study. They installed video cameras and sensors in 100 vehicles that were driven by ordinary people every day for one year. The study gathered nearly 2 million miles of real-world driving data. It found that driver drowsiness was a contributing factor in at least 20 percent of all crashes. The researchers estimated that you are four to six times more likely to have a crash or near crash when you are driving drowsy.    [facility] SLEEP DISORDERS [facility]:   Sleep Hygiene Instructions*      1. Sleep only as much as you need to feel refreshed during the following day.  Restricting your time in bed helps to consolidate and deepen your sleep. Excessively long times in bed lead to fragmented (waking up a lot) and shallow sleep. Get up at your regular time the next day, no matter how little you slept.     2. Get up at the same time every morning.  Do this even on weekends and holidays. This helps to set your "biological clock."     3. Exercise regularly.   Regular exercise improves the quality of sleep, allowing you to fall asleep and stay asleep more easily. Many people do best to exercise during the morning or daytime hours, and should avoid exercise within 3 hours of bedtime. Other individuals, however, will actually find that evening exercise will help them improve their sleep. Go ahead, experiment with the timing of  your exercise.  You should always talk to your  doctor before you begin an exercise program.     4. Make your bedroom quiet, cool, and dark. An easy way to remember this: it should remind you of a cave. While this may not sound romantic, it seems to work for bats. Bats are champion sleepers. They get about 16 hours of sleep each day. Maybe it's because they sleep in dark, cool caves.    5. Don't go to bed unless you are sleepy.  If you are not sleepy at bedtime, then do something else. Read a book, listen to soft music or browse through a magazine. Find something relaxing, but not stimulating, to take your mind off of worries about sleep. This will relax your body and distract your mind.    6. Eat regular meals and do not go to bed hungry. Hunger can disturb sleep. A light snack at bedtime may help you sleep better. Healthy options include low-fat yogurt, fruit, cereal, or whole-grain bread. Also, you should avoid over-eating in the evening as this can also disturb sleep.    7. Cut down on all caffeine products. Caffeinated beverages and foods, such as coffee, tea, soda, energy drinks, and chocolate, can cause difficulty falling asleep, awakenings during the night, and shallow sleep. For some people, even caffeine early in the day can disrupt nighttime sleep. If you know you are sensitive to caffeine, it should be avoided. However, caffeine can fend off daytime fatigue and sleepiness so if moderate amounts are consumed at the right time it can be helpful. In general, avoid caffeine intake after 4:00 pm.    8. Avoid alcohol (beer, wine, and spirits), especially in the evening. Although alcohol can help some folks fall asleep easily, it often leads to disrupted sleep and more frequent nighttime awakenings.     9. Avoid excessive liquids in the evening.  Many individuals, particularly older adults, will have to use the restroom frequently during the night. Avoiding excess liquids can keep this to a minimum.  10. Smoking  a cigarette or other sources of nicotine should be avoided before bedtime.  Nicotine acts as a stimulant in our brains, and can disturb sleep. Try not to smoke when you wake up in the middle of the night.     11. Begin rituals that help you relax each night before bed. This can include such things as a warm bath, light snack or a few minutes of reading.    12. Avoid taking naps if you can. If you must take a nap, try to keep it short (less than thirty minutes). Never take a nap after 3 p.m.    13. Turn your Alarm Clock around! Clock watching can lead to worry, frustration and anger all of which can interfere with a good night's sleep.    14. Don't read, write, eat, watch TV, talk on the phone, or use your computer in bed.         * adapted from [employer] - Sleep Education Website

## 2021-05-06 ENCOUNTER — Other Ambulatory Visit
Admission: RE | Admit: 2021-05-06 | Discharge: 2021-05-06 | Disposition: A | Discharge status: Home / Self Care | Payer: Medicare (Managed Care) | Source: Ambulatory Visit | Attending: Internal Medicine | Admitting: Internal Medicine

## 2021-05-06 DIAGNOSIS — R7301 Impaired fasting glucose: Secondary | ICD-10-CM | POA: Insufficient documentation

## 2021-05-06 DIAGNOSIS — E7800 Pure hypercholesterolemia, unspecified: Secondary | ICD-10-CM | POA: Insufficient documentation

## 2021-05-06 LAB — COMPREHENSIVE METABOLIC PANEL
ALT: 21 U/L (ref 0–35)
AST: 24 U/L (ref 0–35)
Albumin: 4.2 g/dL (ref 3.5–5.2)
Alk Phos: 63 U/L (ref 35–105)
Anion Gap: 8 (ref 7–16)
Bilirubin,Total: 0.3 mg/dL (ref 0.0–1.2)
CO2: 26 mmol/L (ref 20–28)
Calcium: 9.9 mg/dL (ref 8.6–10.2)
Chloride: 104 mmol/L (ref 96–108)
Creatinine: 0.77 mg/dL (ref 0.51–0.95)
Glucose: 101 mg/dL — ABNORMAL HIGH (ref 60–99)
Lab: 19 mg/dL (ref 6–20)
Potassium: 4.1 mmol/L (ref 3.4–4.7)
Sodium: 138 mmol/L (ref 133–145)
Total Protein: 7.1 g/dL (ref 6.3–7.7)
eGFR BY CREAT: 91 *

## 2021-05-06 LAB — LIPID PANEL
Chol/HDL Ratio: 2.8
Cholesterol: 178 mg/dL
HDL: 64 mg/dL — ABNORMAL HIGH (ref 40–60)
LDL Calculated: 92 mg/dL
Non HDL Cholesterol: 114 mg/dL
Triglycerides: 108 mg/dL

## 2021-05-07 LAB — HEMOGLOBIN A1C: Hemoglobin A1C: 5.8 % — ABNORMAL HIGH

## 2021-05-11 ENCOUNTER — Other Ambulatory Visit: Payer: Self-pay | Admitting: Internal Medicine

## 2021-05-11 ENCOUNTER — Other Ambulatory Visit: Payer: Self-pay

## 2021-05-11 ENCOUNTER — Ambulatory Visit
Admission: RE | Admit: 2021-05-11 | Discharge: 2021-05-11 | Disposition: A | Discharge status: Home / Self Care | Payer: Medicare (Managed Care) | Source: Ambulatory Visit | Attending: Internal Medicine | Admitting: Internal Medicine

## 2021-05-11 ENCOUNTER — Encounter: Payer: Self-pay | Admitting: Sleep Medicine

## 2021-05-11 DIAGNOSIS — Z1231 Encounter for screening mammogram for malignant neoplasm of breast: Secondary | ICD-10-CM

## 2021-05-11 DIAGNOSIS — G4733 Obstructive sleep apnea (adult) (pediatric): Secondary | ICD-10-CM

## 2021-05-11 NOTE — Telephone Encounter (Signed)
Last visit: 04/23/21 with Donivan Scull  Next visit:04/21/22 with Dr. Lacie Draft  ISTOP checked  Last fill 9/9 for 30 days

## 2021-05-14 MED ORDER — AMPHETAMINE-DEXTROAMPHETAMINE 30 MG PO CP24 *I*
30.0000 mg | ORAL_CAPSULE | Freq: Every morning | ORAL | 0 refills | Status: DC
Start: 2021-05-14 — End: 2022-04-21

## 2021-05-14 MED ORDER — AMPHETAMINE-DEXTROAMPHETAMINE 20 MG PO TABS *I*
20.0000 mg | ORAL_TABLET | Freq: Every day | ORAL | 0 refills | Status: DC | PRN
Start: 2021-05-14 — End: 2022-04-21

## 2021-05-22 ENCOUNTER — Encounter: Payer: Self-pay | Admitting: Sleep Medicine

## 2021-05-22 DIAGNOSIS — G4733 Obstructive sleep apnea (adult) (pediatric): Secondary | ICD-10-CM

## 2021-05-22 DIAGNOSIS — G4711 Idiopathic hypersomnia with long sleep time: Secondary | ICD-10-CM

## 2021-05-22 NOTE — Telephone Encounter (Signed)
Last visit 04/23/21 with Amy Allen

## 2021-06-04 MED ORDER — SOLRIAMFETOL HCL 75 MG PO TABS *I*
ORAL_TABLET | ORAL | 0 refills | Status: DC
Start: 2021-06-04 — End: 2021-06-08

## 2021-06-04 NOTE — Addendum Note (Signed)
Addended byDonivan Scull on: 06/04/2021 09:42 AM     Modules accepted: Orders

## 2021-06-08 MED ORDER — SOLRIAMFETOL HCL 75 MG PO TABS *I*
ORAL_TABLET | ORAL | 0 refills | Status: DC
Start: 2021-06-08 — End: 2021-07-16

## 2021-06-08 NOTE — Telephone Encounter (Signed)
Telephone call to [facility] and cancelled Sunosi order.  Sunosi order sent to [facility] as requested.

## 2021-06-08 NOTE — Addendum Note (Signed)
Addended byDonivan Scull on: 06/08/2021 03:58 PM     Modules accepted: Orders

## 2021-06-11 ENCOUNTER — Telehealth: Payer: Self-pay

## 2021-06-11 NOTE — Telephone Encounter (Signed)
PA approved for Sunosi through 06/10/22

## 2021-06-17 NOTE — Telephone Encounter (Signed)
New request started in CoverMyMeds

## 2021-06-25 ENCOUNTER — Encounter: Payer: Self-pay | Admitting: Sleep Medicine

## 2021-07-15 ENCOUNTER — Encounter: Payer: Self-pay | Admitting: Sleep Medicine

## 2021-07-15 DIAGNOSIS — G4733 Obstructive sleep apnea (adult) (pediatric): Secondary | ICD-10-CM

## 2021-07-15 DIAGNOSIS — G4711 Idiopathic hypersomnia with long sleep time: Secondary | ICD-10-CM

## 2021-07-16 MED ORDER — SOLRIAMFETOL HCL 75 MG PO TABS *I*
ORAL_TABLET | ORAL | 1 refills | Status: DC
Start: 2021-07-16 — End: 2021-08-27

## 2021-07-16 NOTE — Telephone Encounter (Signed)
Last visit: 04/23/21 with Donivan Scull  Next visit:04/21/22 with Dr. Lacie Draft  ISTOP checked  Last fill:  Patient Name: Bonnie Faulkner   Birth Date: July 15, 1966   Address: [address] [place], [place] [address]   Sex: Female   Rx Written Rx Dispensed Drug Quantity Days Supply Prescriber Name Prescriber Dea # Payment Method Dispenser   06/08/2021 06/24/2021 sunosi 75 mg tablet  30 30 [name] [id number] [facility] [phone]

## 2021-07-28 ENCOUNTER — Encounter: Payer: Self-pay | Admitting: Sleep Medicine

## 2021-07-29 NOTE — Telephone Encounter (Signed)
I spoke to the pharmacist and there is a new NDC and so needs a new PA,   Call to [facility] 4 times today and was hung up on 2 times and was unable to get any resolution even asking for a supervisor

## 2021-07-30 NOTE — Telephone Encounter (Signed)
Per patient MyChart it should be sent to Medicare   PA resubmitted to [facility]

## 2021-07-31 NOTE — Telephone Encounter (Signed)
Denial fax from [facility]  Placed in providers box

## 2021-08-04 NOTE — Telephone Encounter (Signed)
re attempted to process PA with updated information

## 2021-08-11 NOTE — Telephone Encounter (Signed)
Per ISTOP it was filled 08/04/21

## 2021-08-26 ENCOUNTER — Encounter: Payer: Self-pay | Admitting: Sleep Medicine

## 2021-08-26 DIAGNOSIS — G4733 Obstructive sleep apnea (adult) (pediatric): Secondary | ICD-10-CM

## 2021-08-26 DIAGNOSIS — G4711 Idiopathic hypersomnia with long sleep time: Secondary | ICD-10-CM

## 2021-08-26 NOTE — Telephone Encounter (Signed)
Last visit: 04/23/21 with Donivan Scull  Next visit:04/21/22 with Dr. Lacie Draft  ISTOP checked  Last fill 08/04/21 for 30 days

## 2021-08-27 MED ORDER — SOLRIAMFETOL HCL 150 MG PO TABS *A*
ORAL_TABLET | ORAL | 0 refills | Status: DC
Start: 2021-08-27 — End: 2021-09-24

## 2021-09-03 ENCOUNTER — Telehealth: Payer: Self-pay

## 2021-09-03 DIAGNOSIS — G4733 Obstructive sleep apnea (adult) (pediatric): Secondary | ICD-10-CM

## 2021-09-03 MED ORDER — GENERIC DME *A*
0 refills | Status: AC
Start: 2021-09-03 — End: ?

## 2021-09-03 NOTE — Telephone Encounter (Signed)
[facility] received a request from [facility] for a Mandibular Advancement Device Rx and Letter of Medical Necessity.

## 2021-09-11 ENCOUNTER — Encounter: Payer: Self-pay | Admitting: Sleep Medicine

## 2021-09-14 NOTE — Telephone Encounter (Signed)
Writer received a fax confirmation letter at 10:02AM, stating that the oral appliance Rx and letter of medical necessity was sent to [facility].    Writer called the office, and spoke with Florentina Addison who confirmed that the fax was received and uploaded to the patient's chart.    Writer encouraged their office to call for any further questions or information needed.

## 2021-09-24 ENCOUNTER — Encounter: Payer: Self-pay | Admitting: Sleep Medicine

## 2021-09-24 DIAGNOSIS — G4711 Idiopathic hypersomnia with long sleep time: Secondary | ICD-10-CM

## 2021-09-24 DIAGNOSIS — G4733 Obstructive sleep apnea (adult) (pediatric): Secondary | ICD-10-CM

## 2021-09-24 MED ORDER — SOLRIAMFETOL HCL 150 MG PO TABS *A*
ORAL_TABLET | ORAL | 0 refills | Status: DC
Start: 2021-09-24 — End: 2021-10-23

## 2021-09-24 NOTE — Telephone Encounter (Signed)
Last visit: 04/23/21 with Marlaine Hind  Next visit:04/21/22 with Dr. Valda Favia  ISTOP checked  Last fill 08/31/21 for 30 days

## 2021-10-22 ENCOUNTER — Encounter: Payer: Self-pay | Admitting: Sleep Medicine

## 2021-10-22 DIAGNOSIS — G4711 Idiopathic hypersomnia with long sleep time: Secondary | ICD-10-CM

## 2021-10-22 DIAGNOSIS — G4733 Obstructive sleep apnea (adult) (pediatric): Secondary | ICD-10-CM

## 2021-10-23 NOTE — Telephone Encounter (Signed)
Last visit: 04/23/21 with Donivan Scull  Next visit:04/21/22 with Dr. Lacie Draft  ISTOP checked  Last fill 09/29/21 for 30 days

## 2021-10-26 MED ORDER — SOLRIAMFETOL HCL 150 MG PO TABS *A*
ORAL_TABLET | ORAL | 0 refills | Status: DC
Start: 2021-10-26 — End: 2021-11-25

## 2021-11-18 ENCOUNTER — Other Ambulatory Visit
Admission: RE | Admit: 2021-11-18 | Discharge: 2021-11-18 | Disposition: A | Discharge status: Home / Self Care | Payer: Medicare (Managed Care) | Source: Ambulatory Visit | Attending: Internal Medicine | Admitting: Internal Medicine

## 2021-11-18 DIAGNOSIS — E7800 Pure hypercholesterolemia, unspecified: Secondary | ICD-10-CM | POA: Insufficient documentation

## 2021-11-18 DIAGNOSIS — R7301 Impaired fasting glucose: Secondary | ICD-10-CM | POA: Insufficient documentation

## 2021-11-18 LAB — COMPREHENSIVE METABOLIC PANEL
ALT: 19 U/L (ref 0–35)
AST: 23 U/L (ref 0–35)
Albumin: 4.3 g/dL (ref 3.5–5.2)
Alk Phos: 76 U/L (ref 35–105)
Anion Gap: 9 (ref 7–16)
Bilirubin,Total: 0.2 mg/dL (ref 0.0–1.2)
CO2: 26 mmol/L (ref 20–28)
Calcium: 9.7 mg/dL (ref 8.6–10.2)
Chloride: 106 mmol/L (ref 96–108)
Creatinine: 0.76 mg/dL (ref 0.51–0.95)
Glucose: 89 mg/dL (ref 60–99)
Lab: 15 mg/dL (ref 6–20)
Potassium: 4.5 mmol/L (ref 3.3–5.1)
Sodium: 141 mmol/L (ref 133–145)
Total Protein: 6.5 g/dL (ref 6.3–7.7)
eGFR BY CREAT: 92 *

## 2021-11-18 LAB — LIPID PANEL
Chol/HDL Ratio: 2.9
Cholesterol: 196 mg/dL
HDL: 67 mg/dL — ABNORMAL HIGH (ref 40–60)
LDL Calculated: 101 mg/dL
Non HDL Cholesterol: 129 mg/dL
Triglycerides: 138 mg/dL

## 2021-11-19 LAB — HEMOGLOBIN A1C: Hemoglobin A1C: 5.8 % — ABNORMAL HIGH

## 2021-11-20 ENCOUNTER — Encounter: Payer: Self-pay | Admitting: Sleep Medicine

## 2021-11-20 DIAGNOSIS — G4733 Obstructive sleep apnea (adult) (pediatric): Secondary | ICD-10-CM

## 2021-11-20 DIAGNOSIS — G4711 Idiopathic hypersomnia with long sleep time: Secondary | ICD-10-CM

## 2021-11-25 NOTE — Telephone Encounter (Signed)
Last visit: 04/23/21 with Donivan Scull  Next visit:04/21/22 with Dr. Lacie Draft  ISTOP checked  Last fill 10/28/21 for 30 days

## 2021-11-26 MED ORDER — SOLRIAMFETOL HCL 150 MG PO TABS *A*
ORAL_TABLET | ORAL | 0 refills | Status: DC
Start: 2021-11-26 — End: 2021-12-28

## 2021-11-27 NOTE — Telephone Encounter (Signed)
Call to pharmacy, we have her as having medicare and Medicaid, previous authorization was through [place]    I spoke to the pharmacist and he said that it should be through her Medicare    Prior auth started in CoverMyMeds([id number])

## 2021-11-30 NOTE — Telephone Encounter (Signed)
[name]  to [facility] (supporting [name], [name])    DJ     05/22/21 12:43 PM  Hi Amy,   I know it has only been a month since I spoke with you, but I think it's time to change my medication.  During a recent visit to my PCP, my blood pressure was elevated. I have ceased taking the 30 mg ER capsule and I am only taking half the 20 mg IR at a time so as not to have withdrawal.  Please advise on how to further proceed.  Thanks,  Delford Field,  I am sorry to hear that your blood pressure is a concern.  What was the concerning blood pressure reading?  Have you been able to check your blood pressure since decreasing your Adderall dose?  If so, any improvement?  As far as other medications, all options to treat excessive daytime sleepiness all as associated with possible increased blood pressure but the stimulants are associated with this most of all.  Alternatives include Modafinil and Armodafinil, which you have tried before, and a newer medication called Sunosi, which I included information about below.  What are your thoughts with this information?  I look forward to hearing back from you as we make a plan,  Amy J. Sharlyne Pacas    [facility]  [address] [place], [place] [address]  Phone: [phone]  Fax: [phone]

## 2021-11-30 NOTE — Telephone Encounter (Signed)
Bonnie Faulkner, Amy [place], [name] (1:05 PM)     Bonnie Faulkner has no none drug interactions with escitaloptam.    I do not need the side effects like I had with the amphetamines.         Bonnie Faulkner, [name], [name] (1:01 PM)     Bonnie Faulkner  Interferes with escitalopram - which I have been on for about 10 years.    Interactions between your drugs  Moderate  escitalopram  armodafinil  Applies to: Lexapro (escitalopram) and armodafinil  Armodafinil may increase the blood levels and effects of escitalopram. You may have an increased risk of developing side effects, including irregular heart rhythm and a rare but serious condition called the serotonin syndrome, which may include symptoms such as confusion, hallucination, seizure, extreme changes in blood pressure, increased heart rate, fever, excessive sweating, shivering or shaking, blurred vision, muscle spasm or stiffness, tremor, incoordination, stomach cramp, nausea, vomiting, and diarrhea. You should contact your doctor immediately if you experience these symptoms while taking the medications. Your doctor may be able to prescribe alternatives that do not interact, or you may need a dose adjustment or more frequent monitoring to safely use both medications. It is important to tell your doctor about all other medications you use, including vitamins and herbs. Do not stop using any medications without first talking to your doctor.       Bonnie Faulkner, [name], [name] (12:04 PM)     Bonnie Faulkner  I believe I have tried both.  They were not effective as far as helping with my excessive sleepiness and that was the reason we switched.        [name], [name]  [name] 3 hours ago (11:42 AM)     AA  Bonnie,  I found in the record where it said that Modafinil caused you dry mouth.  Were there any other side effects that you recall to make our case on?  Amy       [name], [name]  [name] 3 hours ago (11:40 AM)      [name],  For the prior auth, they are looking to see that you have tried both armodafinil and modafinil.  I have on file that you came to our office on armodafinil (aka Nugivil) that was not effective, which is why you were switched to stimulants but there is nothing on record about Modafinil.   Have you ever tried Modafinil(aka Provigil)?  It is in the same class of medication as the armodafinil.    Please let me know so I can proceed with the auth.  Amy

## 2021-11-30 NOTE — Telephone Encounter (Signed)
PA submitted in CoverMyMeds

## 2021-12-01 ENCOUNTER — Telehealth: Payer: Self-pay | Admitting: Sleep Medicine

## 2021-12-01 NOTE — Telephone Encounter (Signed)
Copied from CRM #[id number]. Topic: Medications/Prescriptions - Prior Authorization  >> Dec 01, 2021 12:30 PM Chapman Fitch wrote:  [place]  from [employer]  is calling to advise that the patients authorization was approved for the medication solriamfetol (SUNOSI) 150 MG tablet (Order #[id number]) on 11/26/21    Authorization Number: [id number]    The duration of the authorization ranges from: 10/31/21-12/01/22      Milan  can be reached if necessary at [phone]    . Marland Kitchen

## 2021-12-25 ENCOUNTER — Encounter: Payer: Self-pay | Admitting: Sleep Medicine

## 2021-12-25 DIAGNOSIS — G4711 Idiopathic hypersomnia with long sleep time: Secondary | ICD-10-CM

## 2021-12-25 DIAGNOSIS — G4733 Obstructive sleep apnea (adult) (pediatric): Secondary | ICD-10-CM

## 2021-12-28 MED ORDER — SOLRIAMFETOL HCL 150 MG PO TABS *A*
ORAL_TABLET | ORAL | 0 refills | Status: DC
Start: 2021-12-28 — End: 2022-01-22

## 2021-12-28 NOTE — Telephone Encounter (Signed)
Refill sent to pharmacy per request  

## 2021-12-28 NOTE — Telephone Encounter (Signed)
Last 04/23/21: Bonnie Faulkner  Next 04/21/22: Bonnie Faulkner  I-STOP: Last fill 12/04/21 27 days

## 2022-01-22 ENCOUNTER — Encounter: Payer: Self-pay | Admitting: Sleep Medicine

## 2022-01-22 DIAGNOSIS — G4733 Obstructive sleep apnea (adult) (pediatric): Secondary | ICD-10-CM

## 2022-01-22 DIAGNOSIS — G4711 Idiopathic hypersomnia with long sleep time: Secondary | ICD-10-CM

## 2022-01-22 MED ORDER — SOLRIAMFETOL HCL 150 MG PO TABS *A*
ORAL_TABLET | ORAL | 0 refills | Status: DC
Start: 2022-01-22 — End: 2022-01-26

## 2022-01-26 MED ORDER — SOLRIAMFETOL HCL 150 MG PO TABS *A*
ORAL_TABLET | ORAL | 0 refills | Status: DC
Start: 2022-01-26 — End: 2022-02-23

## 2022-01-26 NOTE — Telephone Encounter (Signed)
Last visit: 04/23/2021 with Donivan Scull  Next visit:04/21/22 with Dr. Lacie Draft  ISTOP checked  Last fill 12/31/21 for 30 days

## 2022-01-26 NOTE — Addendum Note (Signed)
Addended by: Erline Levine B on: 01/26/2022 01:55 PM     Modules accepted: Orders

## 2022-02-23 ENCOUNTER — Encounter: Payer: Self-pay | Admitting: Sleep Medicine

## 2022-02-23 DIAGNOSIS — G4733 Obstructive sleep apnea (adult) (pediatric): Secondary | ICD-10-CM

## 2022-02-23 DIAGNOSIS — G4711 Idiopathic hypersomnia with long sleep time: Secondary | ICD-10-CM

## 2022-02-23 MED ORDER — SOLRIAMFETOL HCL 150 MG PO TABS *A*
ORAL_TABLET | ORAL | 0 refills | Status: DC
Start: 2022-02-23 — End: 2022-03-25

## 2022-02-23 NOTE — Telephone Encounter (Signed)
Last visit: 04/23/21 with Donivan Scull  Next visit:04/21/2022 with Dr. Lacie Draft  ISTOP checked  Last fill 01/31/22 for 30 days

## 2022-02-25 ENCOUNTER — Other Ambulatory Visit
Admission: RE | Admit: 2022-02-25 | Discharge: 2022-02-25 | Disposition: A | Discharge status: Home / Self Care | Payer: Medicare (Managed Care) | Source: Ambulatory Visit | Attending: Internal Medicine | Admitting: Internal Medicine

## 2022-02-25 DIAGNOSIS — R7301 Impaired fasting glucose: Secondary | ICD-10-CM | POA: Insufficient documentation

## 2022-02-25 DIAGNOSIS — Z0000 Encounter for general adult medical examination without abnormal findings: Secondary | ICD-10-CM | POA: Insufficient documentation

## 2022-02-25 LAB — COMPREHENSIVE METABOLIC PANEL
ALT: 21 U/L (ref 0–35)
AST: 22 U/L (ref 0–35)
Albumin: 4.1 g/dL (ref 3.5–5.2)
Alk Phos: 85 U/L (ref 35–105)
Anion Gap: 11 (ref 7–16)
Bilirubin,Total: 0.4 mg/dL (ref 0.0–1.2)
CO2: 24 mmol/L (ref 20–28)
Calcium: 9.9 mg/dL (ref 8.6–10.2)
Chloride: 106 mmol/L (ref 96–108)
Creatinine: 0.86 mg/dL (ref 0.51–0.95)
Glucose: 98 mg/dL (ref 60–99)
Lab: 14 mg/dL (ref 6–20)
Potassium: 4.3 mmol/L (ref 3.3–5.1)
Sodium: 141 mmol/L (ref 133–145)
Total Protein: 6.4 g/dL (ref 6.3–7.7)
eGFR BY CREAT: 79 *

## 2022-02-25 LAB — CBC AND DIFFERENTIAL
Baso # K/uL: 0.1 10*3/uL (ref 0.0–0.1)
Basophil %: 0.8 %
Eos # K/uL: 0.4 10*3/uL (ref 0.0–0.4)
Eosinophil %: 3.6 %
Hematocrit: 41 % (ref 34–45)
Hemoglobin: 13.7 g/dL (ref 11.2–15.7)
IMM Granulocytes #: 0 10*3/uL (ref 0.0–0.0)
IMM Granulocytes: 0.2 %
Lymph # K/uL: 3.4 10*3/uL (ref 1.2–3.7)
Lymphocyte %: 31.5 %
MCH: 30 pg (ref 26–32)
MCHC: 33 g/dL (ref 32–36)
MCV: 91 fL (ref 79–95)
Mono # K/uL: 1.2 10*3/uL — ABNORMAL HIGH (ref 0.2–0.9)
Monocyte %: 10.8 %
Neut # K/uL: 5.7 10*3/uL (ref 1.6–6.1)
Nucl RBC # K/uL: 0 10*3/uL (ref 0.0–0.0)
Nucl RBC %: 0 /100 WBC (ref 0.0–0.2)
Platelets: 282 10*3/uL (ref 160–370)
RBC: 4.6 MIL/uL (ref 3.9–5.2)
RDW: 13.2 % (ref 11.7–14.4)
Seg Neut %: 53.1 %
WBC: 10.6 10*3/uL — ABNORMAL HIGH (ref 4.0–10.0)

## 2022-02-25 LAB — LIPID PANEL
Chol/HDL Ratio: 2.7
Cholesterol: 184 mg/dL
HDL: 69 mg/dL — ABNORMAL HIGH (ref 40–60)
LDL Calculated: 83 mg/dL
Non HDL Cholesterol: 115 mg/dL
Triglycerides: 162 mg/dL — AB

## 2022-02-25 LAB — TSH: TSH: 1.04 u[IU]/mL (ref 0.27–4.20)

## 2022-02-26 LAB — HEMOGLOBIN A1C: Hemoglobin A1C: 5.6 %

## 2022-03-25 ENCOUNTER — Encounter: Payer: Self-pay | Admitting: Sleep Medicine

## 2022-03-25 DIAGNOSIS — G4711 Idiopathic hypersomnia with long sleep time: Secondary | ICD-10-CM

## 2022-03-25 DIAGNOSIS — G4733 Obstructive sleep apnea (adult) (pediatric): Secondary | ICD-10-CM

## 2022-03-25 NOTE — Telephone Encounter (Signed)
Last visit: 04/23/2021 with Donivan Scull  Next visit:04/21/2022 with Dr. Lacie Draft  ISTOP checked  Last fill 03/02/22 for 30 days

## 2022-03-26 MED ORDER — SOLRIAMFETOL HCL 150 MG PO TABS *A*
ORAL_TABLET | ORAL | 0 refills | Status: DC
Start: 2022-03-26 — End: 2022-04-21

## 2022-04-21 ENCOUNTER — Other Ambulatory Visit: Payer: Self-pay

## 2022-04-21 ENCOUNTER — Ambulatory Visit: Payer: Medicare (Managed Care) | Attending: Sleep Medicine | Admitting: Sleep Medicine

## 2022-04-21 ENCOUNTER — Encounter: Payer: Self-pay | Admitting: Sleep Medicine

## 2022-04-21 VITALS — BP 140/69 | HR 68 | Temp 98.4°F | Ht 65.5 in | Wt 227.8 lb

## 2022-04-21 DIAGNOSIS — G4711 Idiopathic hypersomnia with long sleep time: Secondary | ICD-10-CM | POA: Insufficient documentation

## 2022-04-21 DIAGNOSIS — G4710 Hypersomnia, unspecified: Secondary | ICD-10-CM | POA: Insufficient documentation

## 2022-04-21 DIAGNOSIS — G4733 Obstructive sleep apnea (adult) (pediatric): Secondary | ICD-10-CM | POA: Insufficient documentation

## 2022-04-21 MED ORDER — SOLRIAMFETOL HCL 150 MG PO TABS *A*
ORAL_TABLET | ORAL | 0 refills | Status: AC
Start: 2022-04-28 — End: ?

## 2022-04-21 NOTE — Progress Notes (Signed)
[facility]  -  Sleep Center   Dr. Valetta Fuller , M.D. - Follow Up Visit    Bonnie Faulkner  [id number]    Dear No ref. 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  I last saw her about 2 years ago, but she has been in touch with my office since.    She switch from Adderall to Sunosi 150 mg a little while ago.  This is helping her to keep alert and she has no side effects to it.  She thinks the effect is much better than when she was taking Adderall.  She has been monitoring her blood pressure which apparently is normal, except for when she comes to our office.  She needs a refill.    She continues to use CPAP.  Does not like to use it because she finds it inconvenient.  Still, no technical issues with it.  We interrogated her machine for the last 90 days.  She is averaging about 6 hours of use at night.  Pressure range 5-20, median 11, maximum 14 centimeters of water.  Reasonable air leak.  Calculated AHI 1.0 events per hour.    The patient had a mandibular advancement appliance fashioned about 6 months ago through her dentist.  She did this both for sleep apnea as well as bruxism.  She had longstanding bruxism and TMJ dysfunction.  She tells me she tried many bite guards, but more through basically all of them.  She has been adjusting the mandibular advancement appliance, and at this point it is comfortable.  Her TMJ symptoms have improved markedly.  She is using the appliance along with the CPAP.  When she takes a nap, she does it with the appliance alone without the CPAP.  She thinks her breathing is better and her sleep is better than at baseline, but she is not sure its enough to totally resolve the apnea.    She is gained about 15 pounds.    She is moving to [place] in about 7 days.  She is now totally estranged from her local family.  She has an appointment with a new sleep provider in October.               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 Duration: Lifetime. 1 each 0    B COMPLEX-C-E PO Take by mouth      Escitalopram Oxalate (LEXAPRO PO) Take 20 mg by mouth        multi-vitamin (MULTIVITAMIN) per tablet Take 1 tablet by mouth daily      calcium-vitamin D (OSCAL-500) 500-200 MG-UNIT per tablet Take 1 tablet by mouth 2 times daily      fluticasone (FLONASE) 50 MCG/ACT nasal spray 1 spray by Nasal route daily  ALPRAZolam (XANAX PO) Take by mouth as needed      SUMAtriptan (IMITREX) 20 MG/ACT nasal spray 1 spray by Nasal route as needed for Migraine   Inhale into one nostril at onset of headache. May repeat once in 2 hours.      Misc. Devices (CPAP) machine Please issue any necessary supplies for CPAP.  Dx:OSA  Length:life 1 each 0     No current facility-administered medications on file prior to visit.          Objective   PHYSICAL EXAM         Vitals:    04/21/22 1258   BP: 140/69   Pulse: 68   Temp: 36.9 C (98.4 F)   Weight: 103.3 kg (227 lb 12.8 oz)   Height: 1.664 m (5' 5.5")            IMPRESSION     56 year old woman comes for follow-up.  1.  Sleep disordered breathing.  She has longstanding severe obstructive sleep apnea.  She has been using CPAP successfully, although she does not particularly like it.  Now she has a mandibular advancement appliance which is helping both with bruxism and potentially sleep disordered breathing.  I recommend evaluating her sleep disordered breathing with her oral appliance alone.  2.  Hypersomnia.  This is residual hypersomnia despite  adequate treatment of OSA.  She switched from Adderall to [facility] and finds it effective and well-tolerated.    PLAN     Continue CPAP nightly.  2. Recommend home apnea screen with her oral appliance alone.  She plans on pursuing this through her new sleep provider.  3. Continue the mandibular advancement appliance.  4. Continue Sunosi.  I did provide a 30-day supply for her to fill in [place] until she can meet with her new sleep provider.  5. Encouraged efforts at weight loss.  She will continue to monitor blood pressure.        Sincerely,     Fredrik Cove, MD  [facility]  -  Sleep Center        Thank you for allowing me to participate in this patient's care.  Please do not hesitate to contact me with questions or concerns.  I personally spent 30 minutes on the calendar day of the encounter, including pre and post visit work.  The patient had no further questions at the end of our visit.      *This note was dictated using Dragon [employer] 10.1, reasonable efforts were made to correct for any dictation errors.    This document was electronically signed by Fredrik Cove, MD on 04/21/2022 at 1:57 PM.

## 2022-04-22 ENCOUNTER — Telehealth: Payer: Self-pay

## 2022-04-22 NOTE — Telephone Encounter (Signed)
Patient signed medical release form at appointment yesterday and is requesting records be sent to her new provider in [place]. Records printed and faxed to new office. [facility] [phone]

## 2022-05-11 ENCOUNTER — Telehealth: Payer: Self-pay | Admitting: Sleep Medicine

## 2022-05-11 NOTE — Telephone Encounter (Signed)
Copied from CRM [id number]. Topic: Information Request - Other  >> May 11, 2022  8:47 AM Jacqulynn Cadet wrote:  Type of information request: Misty Stanley from [facility] called and was inquiring if there was a sleep study that could be sent to her. Her fax number is [phone]    Lisa's contact is [phone]

## 2022-05-11 NOTE — Telephone Encounter (Signed)
Called and LM for Misty Stanley stating we do not have a sleep study on file for this patient.

## 2022-05-14 ENCOUNTER — Ambulatory Visit: Payer: Medicare (Managed Care)

## 8253-10-07 DEATH — deceased
